# Patient Record
Sex: Female | Born: 1985 | Race: Black or African American | Hispanic: No | Marital: Married | Smoking: Never smoker
Health system: Southern US, Community
[De-identification: ages and names within clinical notes are randomized; demographics above are authoritative.]

## PROBLEM LIST (undated history)

## (undated) ENCOUNTER — Inpatient Hospital Stay (HOSPITAL_COMMUNITY): Payer: Self-pay

## (undated) DIAGNOSIS — O24419 Gestational diabetes mellitus in pregnancy, unspecified control: Secondary | ICD-10-CM

## (undated) DIAGNOSIS — O09299 Supervision of pregnancy with other poor reproductive or obstetric history, unspecified trimester: Secondary | ICD-10-CM

## (undated) DIAGNOSIS — Z789 Other specified health status: Secondary | ICD-10-CM

## (undated) HISTORY — DX: Other specified health status: Z78.9

## (undated) HISTORY — DX: Gestational diabetes mellitus in pregnancy, unspecified control: O24.419

## (undated) HISTORY — PX: TOOTH EXTRACTION: SUR596

## (undated) HISTORY — DX: Supervision of pregnancy with other poor reproductive or obstetric history, unspecified trimester: O09.299

---

## 2015-09-22 LAB — OB RESULTS CONSOLE GC/CHLAMYDIA
CHLAMYDIA, DNA PROBE: NEGATIVE
GC PROBE AMP, GENITAL: NEGATIVE

## 2015-09-22 LAB — OB RESULTS CONSOLE HGB/HCT, BLOOD
HCT: 36 %
HEMOGLOBIN: 11.6 g/dL

## 2015-09-22 LAB — OB RESULTS CONSOLE PLATELET COUNT: Platelets: 285 10*3/uL

## 2015-09-22 LAB — OB RESULTS CONSOLE VARICELLA ZOSTER ANTIBODY, IGG: Varicella: IMMUNE

## 2015-09-22 LAB — OB RESULTS CONSOLE ABO/RH: RH Type: POSITIVE

## 2015-09-22 LAB — OB RESULTS CONSOLE ANTIBODY SCREEN: ANTIBODY SCREEN: NEGATIVE

## 2015-09-22 LAB — OB RESULTS CONSOLE RUBELLA ANTIBODY, IGM: Rubella: IMMUNE

## 2015-09-22 LAB — OB RESULTS CONSOLE HIV ANTIBODY (ROUTINE TESTING): HIV: NONREACTIVE

## 2015-09-22 LAB — OB RESULTS CONSOLE HEPATITIS B SURFACE ANTIGEN: HEP B S AG: NEGATIVE

## 2015-11-23 ENCOUNTER — Encounter: Payer: Self-pay | Admitting: *Deleted

## 2015-11-23 DIAGNOSIS — Z3490 Encounter for supervision of normal pregnancy, unspecified, unspecified trimester: Secondary | ICD-10-CM

## 2015-11-29 ENCOUNTER — Encounter: Payer: Self-pay | Admitting: *Deleted

## 2015-11-30 ENCOUNTER — Ambulatory Visit (INDEPENDENT_AMBULATORY_CARE_PROVIDER_SITE_OTHER): Payer: Self-pay | Admitting: Advanced Practice Midwife

## 2015-11-30 ENCOUNTER — Encounter: Payer: Self-pay | Admitting: Advanced Practice Midwife

## 2015-11-30 VITALS — BP 98/56 | HR 71 | Temp 98.3°F | Ht 64.0 in | Wt 188.2 lb

## 2015-11-30 DIAGNOSIS — O09899 Supervision of other high risk pregnancies, unspecified trimester: Secondary | ICD-10-CM | POA: Insufficient documentation

## 2015-11-30 DIAGNOSIS — Z3492 Encounter for supervision of normal pregnancy, unspecified, second trimester: Secondary | ICD-10-CM

## 2015-11-30 DIAGNOSIS — O09219 Supervision of pregnancy with history of pre-term labor, unspecified trimester: Secondary | ICD-10-CM

## 2015-11-30 DIAGNOSIS — O09892 Supervision of other high risk pregnancies, second trimester: Secondary | ICD-10-CM

## 2015-11-30 DIAGNOSIS — O09212 Supervision of pregnancy with history of pre-term labor, second trimester: Secondary | ICD-10-CM

## 2015-11-30 DIAGNOSIS — O0990 Supervision of high risk pregnancy, unspecified, unspecified trimester: Secondary | ICD-10-CM | POA: Insufficient documentation

## 2015-11-30 DIAGNOSIS — O0992 Supervision of high risk pregnancy, unspecified, second trimester: Secondary | ICD-10-CM

## 2015-11-30 LAB — POCT URINALYSIS DIP (DEVICE)
Bilirubin Urine: NEGATIVE
Glucose, UA: NEGATIVE mg/dL
HGB URINE DIPSTICK: NEGATIVE
Ketones, ur: NEGATIVE mg/dL
LEUKOCYTES UA: NEGATIVE
NITRITE: NEGATIVE
PH: 5.5 (ref 5.0–8.0)
Protein, ur: NEGATIVE mg/dL
Specific Gravity, Urine: >=1.03 (ref 1.005–1.030)
UROBILINOGEN UA: 0.2 mg/dL (ref 0.0–1.0)

## 2015-11-30 NOTE — Progress Notes (Signed)
Subjective:  Veronica Torres is a 29 y.o. G2P0101 at 2120w3d being seen today for ongoing prenatal care.  She is currently monitored for the following issues for this high-risk pregnancy and has History of preterm delivery, currently pregnant and Supervision of high-risk pregnancy on her problem list.  Patient reports no complaints.  Contractions: Not present. Vag. Bleeding: None.  Movement: Present. Denies leaking of fluid.   The following portions of the patient's history were reviewed and updated as appropriate: allergies, current medications, past family history, past medical history, past social history, past surgical history and problem list. Problem list updated.  Objective:   Filed Vitals:   11/30/15 0921 11/30/15 0922  BP: 98/56   Pulse: 71   Temp: 98.3 F (36.8 C)   Height:  5\' 4"  (1.626 m)  Weight: 188 lb 3.2 oz (85.367 kg)     Fetal Status: Fetal Heart Rate (bpm): 156   Movement: Present     General:  Alert, oriented and cooperative. Patient is in no acute distress.  Skin: Skin is warm and dry. No rash noted.   Cardiovascular: Normal heart rate noted  Respiratory: Normal respiratory effort, no problems with respiration noted  Abdomen: Soft, gravid, appropriate for gestational age. Pain/Pressure: Present     Pelvic: Vag. Bleeding: None     Cervical exam deferred        Extremities: Normal range of motion.  Edema: None  Mental Status: Normal mood and affect. Normal behavior. Normal judgment and thought content.   Urinalysis: Urine Protein: Negative Urine Glucose: Negative  Assessment and Plan:  Pregnancy: G2P0101 at 2820w3d  1. History of preterm delivery, currently pregnant, second trimester --Reviewed pt transfer records including labs, genetic screening, US - US OB DETAIL + 14 WK; Future --Recommend 17-P, pt undecided  Preterm labor symptoms and general obstetric precautions including but not limited to vaginal bleeding, contractions, leaking of fluid and fetal movement  were reviewed in detail with the patient. Please refer to After Visit Summary for other counseling recommendations.  Return in about 4 weeks (around 12/28/2015).   Hurshel PartyLisa A Leftwich-Kirby, CNM

## 2015-12-11 HISTORY — DX: Maternal care for unspecified type scar from previous cesarean delivery: O34.219

## 2015-12-11 NOTE — L&D Delivery Note (Signed)
Obstetrical Delivery Note   Date of Delivery:   05/10/2016 Primary OB:   Other High risk clinic Gestational Age/EDD: 134w4d Antepartum complications: none  Delivered By:   Cornelia Copaharlie Carleta Woodrow, Jr MD  Delivery Type:   spontaneous vaginal delivery  Delivery Details:   Went to see patient for some decels (variables vs early) with pushing and fetus noted to be +4. No more decels while I was in the room and over the next few contractions, she easily delivered an OA infant without difficulty; peds was present at delivery. Infant vigorous and with good cry immediately upon delivery so delayed cord clamping done and then cord cut by father.   Anesthesia:    epidural Intrapartum complications:  Light meconium GBS:    Negative Laceration:    none Episiotomy:    none Placenta:    Delivered and expressed via active management. Intact: yes. To pathology: yes.  Estimated Blood Loss:  150mL  Baby:    Liveborn female, APGARs 9/10, weight 3090gm  Cornelia Copaharlie Gwyn Mehring, Jr. MD Attending Center for [facility](Faculty Practice)

## 2015-12-14 ENCOUNTER — Ambulatory Visit (HOSPITAL_COMMUNITY)
Admission: RE | Admit: 2015-12-14 | Discharge: 2015-12-14 | Disposition: A | Discharge status: Home / Self Care | Payer: Medicaid Other | Source: Ambulatory Visit | Attending: Advanced Practice Midwife | Admitting: Advanced Practice Midwife

## 2015-12-14 ENCOUNTER — Other Ambulatory Visit: Payer: Self-pay | Admitting: General Practice

## 2015-12-14 DIAGNOSIS — Z3689 Encounter for other specified antenatal screening: Secondary | ICD-10-CM

## 2015-12-14 DIAGNOSIS — O09892 Supervision of other high risk pregnancies, second trimester: Secondary | ICD-10-CM

## 2015-12-14 DIAGNOSIS — Z3A20 20 weeks gestation of pregnancy: Secondary | ICD-10-CM

## 2015-12-14 DIAGNOSIS — O09212 Supervision of pregnancy with history of pre-term labor, second trimester: Secondary | ICD-10-CM | POA: Diagnosis present

## 2015-12-14 DIAGNOSIS — O0992 Supervision of high risk pregnancy, unspecified, second trimester: Secondary | ICD-10-CM

## 2015-12-28 ENCOUNTER — Encounter: Payer: Self-pay | Admitting: Family

## 2015-12-28 ENCOUNTER — Ambulatory Visit (INDEPENDENT_AMBULATORY_CARE_PROVIDER_SITE_OTHER): Payer: Self-pay | Admitting: Family

## 2015-12-28 VITALS — BP 114/66 | HR 78 | Temp 98.4°F | Wt 191.0 lb

## 2015-12-28 DIAGNOSIS — O09892 Supervision of other high risk pregnancies, second trimester: Secondary | ICD-10-CM

## 2015-12-28 DIAGNOSIS — O09212 Supervision of pregnancy with history of pre-term labor, second trimester: Secondary | ICD-10-CM

## 2015-12-28 DIAGNOSIS — O0992 Supervision of high risk pregnancy, unspecified, second trimester: Secondary | ICD-10-CM

## 2015-12-28 LAB — POCT URINALYSIS DIP (DEVICE)
Glucose, UA: NEGATIVE mg/dL
NITRITE: NEGATIVE
PH: 6 (ref 5.0–8.0)
Protein, ur: NEGATIVE mg/dL
Specific Gravity, Urine: >=1.03 (ref 1.005–1.030)
Urobilinogen, UA: 0.2 mg/dL (ref 0.0–1.0)

## 2015-12-28 NOTE — Progress Notes (Signed)
Patient reports normal aches and pains of pregnancy Reviewed tip of week with patient Please review ultrasound for dating  Patient has questions about "high risk" diagnosis Review UA results

## 2015-12-28 NOTE — Progress Notes (Signed)
Subjective:  Veronica Torres is a 30 y.o. G2P0101 at [redacted]w[redacted]d being seen today for ongoing prenatal care.  She is currently monitored for the following issues for this high-risk pregnancy and has History of preterm delivery, currently pregnant and Supervision of high-risk pregnancy on her problem list.  Patient reports no complaints.  Contractions: Not present. Vag. Bleeding: None.  Movement: Present. Denies leaking of fluid.   The following portions of the patient's history were reviewed and updated as appropriate: allergies, current medications, past family history, past medical history, past social history, past surgical history and problem list. Problem list updated.  Objective:   Filed Vitals:   12/28/15 0803  BP: 114/66  Pulse: 78  Temp: 98.4 F (36.9 C)  Weight: 191 lb (86.637 kg)    Fetal Status: Fetal Heart Rate (bpm): 150 Fundal Height: 22 cm Movement: Present     General:  Alert, oriented and cooperative. Patient is in no acute distress.  Skin: Skin is warm and dry. No rash noted.   Cardiovascular: Normal heart rate noted  Respiratory: Normal respiratory effort, no problems with respiration noted  Abdomen: Soft, gravid, appropriate for gestational age. Pain/Pressure: Present     Pelvic: Vag. Bleeding: None     Cervical exam deferred        Extremities: Normal range of motion.  Edema: None  Mental Status: Normal mood and affect. Normal behavior. Normal judgment and thought content.   Urinalysis: Urine Protein: Negative Urine Glucose: Negative  Assessment and Plan:  Pregnancy: G2P0101 at [redacted]w[redacted]d  1. History of preterm delivery, currently pregnant, second trimester - discussed 17, benefits > declined  2. Supervision of high-risk pregnancy, second trimester - RPR  Preterm labor symptoms and general obstetric precautions including but not limited to vaginal bleeding, contractions, leaking of fluid and fetal movement were reviewed in detail with the patient. Please refer to  After Visit Summary for other counseling recommendations.  Return in about 4 weeks (around 01/25/2016).   Eino Farber Kennith Gain, CNM

## 2015-12-29 LAB — RPR

## 2016-02-01 ENCOUNTER — Encounter: Payer: Self-pay | Admitting: Advanced Practice Midwife

## 2016-02-08 ENCOUNTER — Ambulatory Visit (INDEPENDENT_AMBULATORY_CARE_PROVIDER_SITE_OTHER): Payer: Medicaid Other | Admitting: Obstetrics and Gynecology

## 2016-02-08 VITALS — BP 97/69 | HR 82 | Temp 98.3°F | Wt 193.3 lb

## 2016-02-08 DIAGNOSIS — Z23 Encounter for immunization: Secondary | ICD-10-CM | POA: Diagnosis not present

## 2016-02-08 DIAGNOSIS — Z3482 Encounter for supervision of other normal pregnancy, second trimester: Secondary | ICD-10-CM | POA: Diagnosis present

## 2016-02-08 DIAGNOSIS — O0990 Supervision of high risk pregnancy, unspecified, unspecified trimester: Secondary | ICD-10-CM

## 2016-02-08 LAB — POCT URINALYSIS DIP (DEVICE)
BILIRUBIN URINE: NEGATIVE
Glucose, UA: NEGATIVE mg/dL
KETONES UR: NEGATIVE mg/dL
Leukocytes, UA: NEGATIVE
NITRITE: NEGATIVE
PH: 6.5 (ref 5.0–8.0)
Protein, ur: 30 mg/dL — AB
SPECIFIC GRAVITY, URINE: 1.015 (ref 1.005–1.030)
Urobilinogen, UA: 0.2 mg/dL (ref 0.0–1.0)

## 2016-02-08 LAB — CBC
HEMATOCRIT: 31.7 % — AB (ref 36.0–46.0)
HEMOGLOBIN: 10.4 g/dL — AB (ref 12.0–15.0)
MCH: 27.4 pg (ref 26.0–34.0)
MCHC: 32.8 g/dL (ref 30.0–36.0)
MCV: 83.4 fL (ref 78.0–100.0)
MPV: 11.1 fL (ref 8.6–12.4)
Platelets: 237 10*3/uL (ref 150–400)
RBC: 3.8 MIL/uL — AB (ref 3.87–5.11)
RDW: 14.1 % (ref 11.5–15.5)
WBC: 6.8 10*3/uL (ref 4.0–10.5)

## 2016-02-08 MED ORDER — TETANUS-DIPHTH-ACELL PERTUSSIS 5-2.5-18.5 LF-MCG/0.5 IM SUSP
0.5000 mL | Freq: Once | INTRAMUSCULAR | Status: AC
  Administered 2016-02-08: 0.5 mL via INTRAMUSCULAR

## 2016-02-08 NOTE — Addendum Note (Signed)
Addended by: Gerome Apley on: 02/08/2016 10:33 AM   Modules accepted: Orders

## 2016-02-08 NOTE — Progress Notes (Signed)
After discussion with Dr. Ashok Pall, Veronica Torres decided she would like to get tdap, but declines flu shot at this time.

## 2016-02-08 NOTE — Progress Notes (Signed)
Declines tdap 

## 2016-02-08 NOTE — Progress Notes (Signed)
Subjective:  Veronica Torres is a 30 y.o. G2P0101 at [redacted]w[redacted]d being seen today for ongoing prenatal care.  She is currently monitored for the following issues for this low-risk pregnancy and has History of preterm delivery, currently pregnant and Supervision of high-risk pregnancy on her problem list.  Patient reports no complaints.  Contractions: Not present. Vag. Bleeding: None.  Movement: Present. Denies leaking of fluid.   The following portions of the patient's history were reviewed and updated as appropriate: allergies, current medications, past family history, past medical history, past social history, past surgical history and problem list. Problem list updated.  Objective:   Filed Vitals:   02/08/16 1005  BP: 97/69  Pulse: 82  Temp: 98.3 F (36.8 C)  Weight: 193 lb 4.8 oz (87.68 kg)    Fetal Status: Fetal Heart Rate (bpm): 160   Movement: Present     General:  Alert, oriented and cooperative. Patient is in no acute distress.  Skin: Skin is warm and dry. No rash noted.   Cardiovascular: Normal heart rate noted  Respiratory: Normal respiratory effort, no problems with respiration noted  Abdomen: Soft, gravid, appropriate for gestational age. Pain/Pressure: Absent     Pelvic: Vag. Bleeding: None     Cervical exam deferred        Extremities: Normal range of motion.  Edema: None  Mental Status: Normal mood and affect. Normal behavior. Normal judgment and thought content.   Urinalysis:      Assessment and Plan:  Pregnancy: G2P0101 at [redacted]w[redacted]d  1. Supervision of high-risk pregnancy, unspecified trimester - tdap and flu. Successfully convinced patient! - 1-hour, cbc, rpr, hiv - f/u 2 wks  Preterm labor symptoms and general obstetric precautions including but not limited to vaginal bleeding, contractions, leaking of fluid and fetal movement were reviewed in detail with the patient. Please refer to After Visit Summary for other counseling recommendations.    Kathrynn Running,  MD

## 2016-02-09 LAB — HIV ANTIBODY (ROUTINE TESTING W REFLEX): HIV 1&2 Ab, 4th Generation: NONREACTIVE

## 2016-02-09 LAB — RPR

## 2016-02-09 LAB — GLUCOSE TOLERANCE, 1 HOUR (50G) W/O FASTING: Glucose, 1 Hr, gestational: 129 mg/dL (ref <140)

## 2016-02-20 ENCOUNTER — Encounter (HOSPITAL_COMMUNITY): Payer: Self-pay | Admitting: Vascular Surgery

## 2016-02-20 DIAGNOSIS — K5900 Constipation, unspecified: Secondary | ICD-10-CM | POA: Insufficient documentation

## 2016-02-20 DIAGNOSIS — Z3A29 29 weeks gestation of pregnancy: Secondary | ICD-10-CM | POA: Insufficient documentation

## 2016-02-20 DIAGNOSIS — R1030 Lower abdominal pain, unspecified: Secondary | ICD-10-CM | POA: Diagnosis not present

## 2016-02-20 DIAGNOSIS — O99413 Diseases of the circulatory system complicating pregnancy, third trimester: Secondary | ICD-10-CM | POA: Insufficient documentation

## 2016-02-20 DIAGNOSIS — M549 Dorsalgia, unspecified: Secondary | ICD-10-CM | POA: Diagnosis not present

## 2016-02-20 DIAGNOSIS — O212 Late vomiting of pregnancy: Secondary | ICD-10-CM | POA: Insufficient documentation

## 2016-02-20 DIAGNOSIS — O9989 Other specified diseases and conditions complicating pregnancy, childbirth and the puerperium: Secondary | ICD-10-CM | POA: Insufficient documentation

## 2016-02-20 LAB — URINE MICROSCOPIC-ADD ON

## 2016-02-20 LAB — URINALYSIS, ROUTINE W REFLEX MICROSCOPIC
BILIRUBIN URINE: NEGATIVE
Glucose, UA: NEGATIVE mg/dL
Hgb urine dipstick: NEGATIVE
KETONES UR: 15 mg/dL — AB
LEUKOCYTES UA: NEGATIVE
NITRITE: NEGATIVE
PROTEIN: 100 mg/dL — AB
Specific Gravity, Urine: 1.021 (ref 1.005–1.030)
pH: 8.5 — ABNORMAL HIGH (ref 5.0–8.0)

## 2016-02-20 LAB — CBC
HCT: 31.3 % — ABNORMAL LOW (ref 36.0–46.0)
Hemoglobin: 10.4 g/dL — ABNORMAL LOW (ref 12.0–15.0)
MCH: 27.2 pg (ref 26.0–34.0)
MCHC: 33.2 g/dL (ref 30.0–36.0)
MCV: 81.7 fL (ref 78.0–100.0)
PLATELETS: 198 10*3/uL (ref 150–400)
RBC: 3.83 MIL/uL — ABNORMAL LOW (ref 3.87–5.11)
RDW: 13.1 % (ref 11.5–15.5)
WBC: 9.1 10*3/uL (ref 4.0–10.5)

## 2016-02-20 MED ORDER — ONDANSETRON 4 MG PO TBDP
ORAL_TABLET | ORAL | Status: AC
  Filled 2016-02-20: qty 1

## 2016-02-20 MED ORDER — ONDANSETRON 4 MG PO TBDP
4.0000 mg | ORAL_TABLET | Freq: Once | ORAL | Status: AC | PRN
  Administered 2016-02-20: 4 mg via ORAL

## 2016-02-20 NOTE — ED Notes (Signed)
Pt reports to the ED for eval of upper abd pain and N/V that began today. Describes the pain as a cramping and like she has food poisoning. Denies any diarrhea. Pt urinary symptoms, denies any vaginal bleeding and d/c. Pt [redacted] weeks pregnant. Has not had any problems with this pregnancy. Pt denies any unusual back pain or urge to bear down. Pt A&Ox4, resp e/u, and skin warm and dry.

## 2016-02-21 ENCOUNTER — Emergency Department (HOSPITAL_COMMUNITY)
Admission: EM | Admit: 2016-02-21 | Discharge: 2016-02-21 | Disposition: A | Discharge status: Home / Self Care | Payer: Medicaid Other | Attending: Emergency Medicine | Admitting: Emergency Medicine

## 2016-02-21 DIAGNOSIS — O09892 Supervision of other high risk pregnancies, second trimester: Secondary | ICD-10-CM

## 2016-02-21 DIAGNOSIS — R109 Unspecified abdominal pain: Secondary | ICD-10-CM

## 2016-02-21 DIAGNOSIS — O26899 Other specified pregnancy related conditions, unspecified trimester: Secondary | ICD-10-CM

## 2016-02-21 DIAGNOSIS — R1110 Vomiting, unspecified: Secondary | ICD-10-CM

## 2016-02-21 DIAGNOSIS — O0990 Supervision of high risk pregnancy, unspecified, unspecified trimester: Secondary | ICD-10-CM

## 2016-02-21 DIAGNOSIS — O09212 Supervision of pregnancy with history of pre-term labor, second trimester: Secondary | ICD-10-CM

## 2016-02-21 LAB — COMPREHENSIVE METABOLIC PANEL
ALK PHOS: 59 U/L (ref 38–126)
ALT: 13 U/L — AB (ref 14–54)
AST: 19 U/L (ref 15–41)
Albumin: 3.1 g/dL — ABNORMAL LOW (ref 3.5–5.0)
Anion gap: 13 (ref 5–15)
BILIRUBIN TOTAL: 0.4 mg/dL (ref 0.3–1.2)
BUN: <5 mg/dL — ABNORMAL LOW (ref 6–20)
CALCIUM: 8.9 mg/dL (ref 8.9–10.3)
CO2: 18 mmol/L — AB (ref 22–32)
CREATININE: 0.58 mg/dL (ref 0.44–1.00)
Chloride: 105 mmol/L (ref 101–111)
Glucose, Bld: 88 mg/dL (ref 65–99)
Potassium: 3.8 mmol/L (ref 3.5–5.1)
SODIUM: 136 mmol/L (ref 135–145)
TOTAL PROTEIN: 6.9 g/dL (ref 6.5–8.1)

## 2016-02-21 LAB — HCG, QUANTITATIVE, PREGNANCY: hCG, Beta Chain, Quant, S: 12635 m[IU]/mL — ABNORMAL HIGH (ref <5)

## 2016-02-21 MED ORDER — SODIUM CHLORIDE 0.9 % IV BOLUS (SEPSIS)
1000.0000 mL | Freq: Once | INTRAVENOUS | Status: AC
  Administered 2016-02-21: 1000 mL via INTRAVENOUS

## 2016-02-21 NOTE — ED Notes (Signed)
Pt verbalizes understanding of instructions. 

## 2016-02-21 NOTE — ED Notes (Signed)
RROB nurse states OB has cleared\ patient

## 2016-02-21 NOTE — ED Provider Notes (Signed)
CSN: [id number]     Arrival date & time 02/20/16  2254 History   First MD Initiated Contact with Patient 02/21/16 0802     Chief Complaint  Patient presents with  . Abdominal Pain     (Consider location/radiation/quality/duration/timing/severity/associated sxs/prior Treatment) HPI  30 year old female who is G2 P1 and approximately [redacted] weeks pregnant presents with nausea, vomiting, and abdominal pain. States she didn't feel well yesterday around noon. Around 7 PM she vomited once. Vomited again about one hour later. No hematemesis. This started after having a bowel movement after being constipated. Has not had any diarrhea. Patient states originally she was having upper abdominal discomfort, now is having some lower abdominal discomfort and back pain. It has improved while she has been in the waiting room for several hours. She was given Zofran once in the waiting room but this seems to have worn off. Mildly nauseated, does not one further medicines at this time. No fevers. She states the abdominal pain feels like a prior time when she had "food poisoning". Denies vaginal bleeding, discharge. She states earlier in the night she was having trouble feeling baby move but after having a piece of candy she states the baby is now quite active.  Past Medical History  Diagnosis Date  . Medical history non-contributory    Past Surgical History  Procedure Laterality Date  . No past surgeries    . Tooth extraction     No family history on file. Social History  Substance Use Topics  . Smoking status: Never Smoker   . Smokeless tobacco: None  . Alcohol Use: No   OB History    Gravida Para Term Preterm AB TAB SAB Ectopic Multiple Living   2 1  1      1      Review of Systems  Constitutional: Negative for fever.  Gastrointestinal: Positive for nausea, vomiting and abdominal pain. Negative for diarrhea.  Genitourinary: Negative for dysuria, vaginal bleeding and vaginal discharge.  Musculoskeletal:  Positive for back pain.  All other systems reviewed and are negative.     Allergies  Review of patient's allergies indicates no known allergies.  Home Medications   Prior to Admission medications   Medication Sig Start Date End Date Taking? Authorizing Provider  Prenatal Vit-Fe Fumarate-FA (MULTIVITAMIN-PRENATAL) 27-0.8 MG TABS tablet Take 1 tablet by mouth daily at 12 noon.    Historical Provider, MD   BP 114/62 mmHg  Pulse 80  Temp(Src) 98.1 F (36.7 C) (Oral)  Resp 16  Wt 196 lb 1.6 oz (88.95 kg)  SpO2 98%  LMP 07/31/2015 Physical Exam  Constitutional: She is oriented to person, place, and time. She appears well-developed and well-nourished. No distress.  HENT:  Head: Normocephalic and atraumatic.  Right Ear: External ear normal.  Left Ear: External ear normal.  Nose: Nose normal.  Eyes: Right eye exhibits no discharge. Left eye exhibits no discharge.  Cardiovascular: Normal rate, regular rhythm and normal heart sounds.   Pulmonary/Chest: Effort normal and breath sounds normal.  Abdominal: Soft. There is tenderness.  Mild lower abdominal tenderness Gravid  Neurological: She is alert and oriented to person, place, and time.  Skin: Skin is warm and dry. She is not diaphoretic.  Nursing note and vitals reviewed.   ED Course  Procedures (including critical care time) Labs Review Labs Reviewed  COMPREHENSIVE METABOLIC PANEL - Abnormal; Notable for the following:    CO2 18 (*)    BUN <5 (*)    Albumin 3.1 (*)  ALT 13 (*)    All other components within normal limits  CBC - Abnormal; Notable for the following:    RBC 3.83 (*)    Hemoglobin 10.4 (*)    HCT 31.3 (*)    All other components within normal limits  URINALYSIS, ROUTINE W REFLEX MICROSCOPIC (NOT AT [facility]) - Abnormal; Notable for the following:    pH 8.5 (*)    Ketones, ur 15 (*)    Protein, ur 100 (*)    All other components within normal limits  HCG, QUANTITATIVE, PREGNANCY - Abnormal; Notable for  the following:    hCG, Beta Chain, Quant, [place] [address] (*)    All other components within normal limits  URINE MICROSCOPIC-ADD ON - Abnormal; Notable for the following:    Squamous Epithelial / LPF 0-5 (*)    Bacteria, UA RARE (*)    All other components within normal limits    Imaging Review No results found. I have personally reviewed and evaluated these images and lab results as part of my medical decision-making.   EKG Interpretation None      MDM   Final diagnoses:  Abdominal pain during pregnancy  Vomiting in adult patient    Patient likely has a gastroenteritis causing vomiting. Pain and nausea much improved, she doesn't want more treatment. Rapid OB has evaluated, NST unremarkable. No concerning symptoms such as VB, leakage of fluid or contractions. Hydrate, f/u with OB. Discussed return precautions. Low suspicion for serious intra-abd pathology such as pregnancy emergency, appendicitis, bowel obstruction, pancreatitis, etc.    Pricilla Loveless, MD 02/21/16 1734

## 2016-02-21 NOTE — ED Notes (Signed)
Pt returns from [facility]. States she had vomiting and abdominal pain last night after eating soup. vomtted x 2. States abdominal pain improved and intermittent.

## 2016-02-21 NOTE — ED Notes (Signed)
Called Dr Macon LargeAnyanwu informed of pt status, pt came into [facility] with vomiting and belly pain. Pt is g2p1 29.2 weeks, denies leaking fluid or bleeding. FHR tracing reassuring, no UC's noted. OB cleared.

## 2016-02-21 NOTE — ED Notes (Signed)
Brought patient back to room with family in tow; patient given gown to get undressed and place on; patient needs to go to the bathroom first, shown where bathroom is; visitor at bedside

## 2016-02-22 ENCOUNTER — Encounter: Payer: Medicaid Other | Admitting: Advanced Practice Midwife

## 2016-02-27 ENCOUNTER — Encounter: Payer: Medicaid Other | Admitting: Family Medicine

## 2016-02-29 ENCOUNTER — Ambulatory Visit (INDEPENDENT_AMBULATORY_CARE_PROVIDER_SITE_OTHER): Payer: Medicaid Other | Admitting: Family

## 2016-02-29 VITALS — BP 111/61 | HR 83 | Temp 98.4°F | Wt 197.0 lb

## 2016-02-29 DIAGNOSIS — O99013 Anemia complicating pregnancy, third trimester: Secondary | ICD-10-CM

## 2016-02-29 DIAGNOSIS — O0993 Supervision of high risk pregnancy, unspecified, third trimester: Secondary | ICD-10-CM

## 2016-02-29 DIAGNOSIS — D649 Anemia, unspecified: Secondary | ICD-10-CM

## 2016-02-29 DIAGNOSIS — O99019 Anemia complicating pregnancy, unspecified trimester: Secondary | ICD-10-CM | POA: Insufficient documentation

## 2016-02-29 LAB — POCT URINALYSIS DIP (DEVICE)
Glucose, UA: NEGATIVE mg/dL
HGB URINE DIPSTICK: NEGATIVE
Ketones, ur: NEGATIVE mg/dL
Leukocytes, UA: NEGATIVE
Nitrite: NEGATIVE
PROTEIN: 30 mg/dL — AB
Specific Gravity, Urine: 1.025 (ref 1.005–1.030)
Urobilinogen, UA: 0.2 mg/dL (ref 0.0–1.0)
pH: 6.5 (ref 5.0–8.0)

## 2016-02-29 MED ORDER — FUSION PLUS PO CAPS: 1.0000 | ORAL_CAPSULE | Freq: Every day | ORAL | Status: DC

## 2016-02-29 NOTE — Progress Notes (Signed)
Subjective:  Veronica Torres is a 30 y.o. G2P0101 at 589w3d being seen today for ongoing prenatal care.  She is currently monitored for the following issues for this high-risk pregnancy and has History of preterm delivery, currently pregnant; Supervision of high-risk pregnancy; and Anemia of mother in pregnancy, antepartum on her problem list.  Patient reports fatigue.  Contractions: Not present.  .  Movement: Present. Denies leaking of fluid.   The following portions of the patient's history were reviewed and updated as appropriate: allergies, current medications, past family history, past medical history, past social history, past surgical history and problem list. Problem list updated.  Objective:   Filed Vitals:   02/29/16 1007  BP: 111/61  Pulse: 83  Temp: 98.4 F (36.9 C)  Weight: 197 lb (89.359 kg)    Fetal Status: Fetal Heart Rate (bpm): 156 Fundal Height: 30 cm Movement: Present     General:  Alert, oriented and cooperative. Patient is in no acute distress.  Skin: Skin is warm and dry. No rash noted.   Cardiovascular: Normal heart rate noted  Respiratory: Normal respiratory effort, no problems with respiration noted  Abdomen: Soft, gravid, appropriate for gestational age. Pain/Pressure: Present     Pelvic:       Cervical exam deferred        Extremities: Normal range of motion.  Edema: None  Mental Status: Normal mood and affect. Normal behavior. Normal judgment and thought content.   Urinalysis: Urine Protein: 1+ Urine Glucose: Negative  Assessment and Plan:  Pregnancy: G2P0101 at 499w3d  1. Anemia of mother in pregnancy, antepartum, third trimester - Iron-FA-B Cmp-C-Biot-Probiotic (FUSION PLUS) CAPS; Take 1 capsule by mouth daily.  Dispense: 30 capsule; Refill: 6  2. Supervision of high-risk pregnancy, third trimester - Reviewed third trimester labs  Preterm labor symptoms and general obstetric precautions including but not limited to vaginal bleeding, contractions,  leaking of fluid and fetal movement were reviewed in detail with the patient. Please refer to After Visit Summary for other counseling recommendations.  Return in about 2 weeks (around 03/14/2016).   Eino FarberWalidah Kennith GainN Karim, CNM

## 2016-03-14 ENCOUNTER — Encounter: Payer: Medicaid Other | Admitting: Advanced Practice Midwife

## 2016-03-28 ENCOUNTER — Ambulatory Visit (INDEPENDENT_AMBULATORY_CARE_PROVIDER_SITE_OTHER): Payer: Medicaid Other | Admitting: Advanced Practice Midwife

## 2016-03-28 VITALS — BP 118/59 | HR 71 | Wt 197.0 lb

## 2016-03-28 DIAGNOSIS — O99013 Anemia complicating pregnancy, third trimester: Secondary | ICD-10-CM

## 2016-03-28 DIAGNOSIS — D649 Anemia, unspecified: Secondary | ICD-10-CM

## 2016-03-28 DIAGNOSIS — O0993 Supervision of high risk pregnancy, unspecified, third trimester: Secondary | ICD-10-CM

## 2016-03-28 LAB — POCT URINALYSIS DIP (DEVICE)
BILIRUBIN URINE: NEGATIVE
GLUCOSE, UA: NEGATIVE mg/dL
LEUKOCYTES UA: NEGATIVE
Nitrite: NEGATIVE
Protein, ur: 100 mg/dL — AB
Urobilinogen, UA: 0.2 mg/dL (ref 0.0–1.0)
pH: 6 (ref 5.0–8.0)

## 2016-03-28 NOTE — Progress Notes (Signed)
Subjective:  Veronica Torres is a 30 y.o. G2P0101 at 579w3d being seen today for ongoing prenatal care.  She is currently monitored for the following issues for this high-risk pregnancy and has History of preterm delivery, currently pregnant; Supervision of high-risk pregnancy; and Anemia of mother in pregnancy, antepartum on her problem list.  Patient reports episode of regular contractions last week that resolved, only occasional cramping now, not regular.  Contractions: Not present.  .  Movement: Present. Denies leaking of fluid.   The following portions of the patient's history were reviewed and updated as appropriate: allergies, current medications, past family history, past medical history, past social history, past surgical history and problem list. Problem list updated.  Objective:   Filed Vitals:   03/28/16 1037  BP: 118/59  Pulse: 71  Weight: 197 lb (89.359 kg)    Fetal Status: Fetal Heart Rate (bpm): 155 Fundal Height: 34 cm Movement: Present     General:  Alert, oriented and cooperative. Patient is in no acute distress.  Skin: Skin is warm and dry. No rash noted.   Cardiovascular: Normal heart rate noted  Respiratory: Normal respiratory effort, no problems with respiration noted  Abdomen: Soft, gravid, appropriate for gestational age. Pain/Pressure: Present     Pelvic:       Cervical exam deferred        Extremities: Normal range of motion.     Mental Status: Normal mood and affect. Normal behavior. Normal judgment and thought content.   Urinalysis:      Assessment and Plan:  Pregnancy: G2P0101 at 419w3d  1. Supervision of high-risk pregnancy, third trimester   2. Anemia of mother in pregnancy, antepartum, third trimester --Hgb 10, but pt fatigued, sometimes SOB. Taking gummy prenatal vitamin with no iron. Rx for Fusion Plus at last visit but did not pick up r/t cost.  Discussed OTC/Rx iron options.  Pt prefers to pick up Fusion Plus and start today.  Preterm labor  symptoms and general obstetric precautions including but not limited to vaginal bleeding, contractions, leaking of fluid and fetal movement were reviewed in detail with the patient. Please refer to After Visit Summary for other counseling recommendations.  Return in about 2 weeks (around 04/11/2016).   Hurshel PartyLisa A Leftwich-Kirby, CNM

## 2016-03-28 NOTE — Patient Instructions (Signed)
Pregnancy and Anemia °Anemia is a condition in which the concentration of red blood cells or hemoglobin in the blood is below normal. Hemoglobin is a substance in red blood cells that carries oxygen to the tissues of the body. Anemia results in not enough oxygen reaching these tissues.  °Anemia during pregnancy is common because the fetus uses more iron and folic acid as it is developing. Your body may not produce enough red blood cells because of this. Also, during pregnancy, the liquid part of the blood (plasma) increases by about 50%, and the red blood cells increase by only 25%. This lowers the concentration of the red blood cells and creates a natural anemia-like situation.  °CAUSES  °The most common cause of anemia during pregnancy is not having enough iron in the body to make red blood cells (iron deficiency anemia). Other causes may include: °· Folic acid deficiency. °· Vitamin B12 deficiency. °· Certain prescription or over-the-counter medicines. °· Certain medical conditions or infections that destroy red blood cells. °· A low platelet count and bleeding caused by antibodies that go through the placenta to the fetus from the mother's blood. °SIGNS AND SYMPTOMS  °Mild anemia may not be noticeable. If it becomes severe, symptoms may include: °· Tiredness. °· Shortness of breath, especially with exercise. °· Weakness. °· Fainting. °· Pale looking skin. °· Headaches. °· Feeling a fast or irregular heartbeat (palpitations). °DIAGNOSIS  °The type of anemia is usually diagnosed from your family and medical history and blood tests. °TREATMENT  °Treatment of anemia during pregnancy depends on the cause of the anemia. Treatment can include: °· Supplements of iron, vitamin B12, or folic acid. °· A blood transfusion. This may be needed if blood loss is severe. °· Hospitalization. This may be needed if there is significant continual blood loss. °· Dietary changes. °HOME CARE INSTRUCTIONS  °· Follow your dietitian's or  health care provider's dietary recommendations. °· Increase your vitamin C intake. This will help the stomach absorb more iron. °· Eat a diet rich in iron. This would include foods such as: °¨ Liver. °¨ Beef. °¨ Whole grain bread. °¨ Eggs. °¨ Dried fruit. °· Take iron and vitamins as directed by your health care provider. °· Eat green leafy vegetables. These are a good source of folic acid. °SEEK MEDICAL CARE IF:  °· You have frequent or lasting headaches. °· You are looking pale. °· You are bruising easily. °SEEK IMMEDIATE MEDICAL CARE IF:  °· You have extreme weakness, shortness of breath, or chest pain. °· You become dizzy or have trouble concentrating. °· You have heavy vaginal bleeding. °· You develop a rash. °· You have bloody or black, tarry stools. °· You faint. °· You vomit up blood. °· You vomit repeatedly. °· You have abdominal pain. °· You have a fever or persistent symptoms for more than 2-3 days. °· You have a fever and your symptoms suddenly get worse. °· You are dehydrated. °MAKE SURE YOU:  °· Understand these instructions. °· Will watch your condition. °· Will get help right away if you are not doing well or get worse. °  °This information is not intended to replace advice given to you by your health care provider. Make sure you discuss any questions you have with your health care provider. °  °Document Released: 11/23/2000 Document Revised: 09/16/2013 Document Reviewed: 07/08/2013 °Elsevier Interactive Patient Education ©2016 [employer] ° °

## 2016-04-10 ENCOUNTER — Inpatient Hospital Stay (HOSPITAL_COMMUNITY)
Admission: AD | Admit: 2016-04-10 | Discharge: 2016-04-10 | Disposition: A | Discharge status: Home / Self Care | Payer: Medicaid Other | Source: Ambulatory Visit | Attending: Family Medicine | Admitting: Family Medicine

## 2016-04-10 ENCOUNTER — Encounter (HOSPITAL_COMMUNITY): Payer: Self-pay | Admitting: *Deleted

## 2016-04-10 DIAGNOSIS — O26893 Other specified pregnancy related conditions, third trimester: Secondary | ICD-10-CM | POA: Diagnosis not present

## 2016-04-10 DIAGNOSIS — O09213 Supervision of pregnancy with history of pre-term labor, third trimester: Secondary | ICD-10-CM | POA: Diagnosis not present

## 2016-04-10 DIAGNOSIS — R002 Palpitations: Secondary | ICD-10-CM | POA: Diagnosis present

## 2016-04-10 DIAGNOSIS — R42 Dizziness and giddiness: Secondary | ICD-10-CM | POA: Diagnosis present

## 2016-04-10 DIAGNOSIS — R0602 Shortness of breath: Secondary | ICD-10-CM | POA: Insufficient documentation

## 2016-04-10 DIAGNOSIS — D649 Anemia, unspecified: Secondary | ICD-10-CM | POA: Diagnosis not present

## 2016-04-10 DIAGNOSIS — O09212 Supervision of pregnancy with history of pre-term labor, second trimester: Secondary | ICD-10-CM

## 2016-04-10 DIAGNOSIS — O99013 Anemia complicating pregnancy, third trimester: Secondary | ICD-10-CM | POA: Insufficient documentation

## 2016-04-10 DIAGNOSIS — O0993 Supervision of high risk pregnancy, unspecified, third trimester: Secondary | ICD-10-CM

## 2016-04-10 DIAGNOSIS — O09892 Supervision of other high risk pregnancies, second trimester: Secondary | ICD-10-CM

## 2016-04-10 DIAGNOSIS — Z3A36 36 weeks gestation of pregnancy: Secondary | ICD-10-CM | POA: Insufficient documentation

## 2016-04-10 LAB — CBC
HEMATOCRIT: 30.8 % — AB (ref 36.0–46.0)
Hemoglobin: 10.1 g/dL — ABNORMAL LOW (ref 12.0–15.0)
MCH: 25.8 pg — AB (ref 26.0–34.0)
MCHC: 32.8 g/dL (ref 30.0–36.0)
MCV: 78.8 fL (ref 78.0–100.0)
PLATELETS: 204 10*3/uL (ref 150–400)
RBC: 3.91 MIL/uL (ref 3.87–5.11)
RDW: 13.6 % (ref 11.5–15.5)
WBC: 7.1 10*3/uL (ref 4.0–10.5)

## 2016-04-10 LAB — URINALYSIS, ROUTINE W REFLEX MICROSCOPIC
BILIRUBIN URINE: NEGATIVE
GLUCOSE, UA: NEGATIVE mg/dL
HGB URINE DIPSTICK: NEGATIVE
Ketones, ur: NEGATIVE mg/dL
Leukocytes, UA: NEGATIVE
Nitrite: NEGATIVE
PH: 6.5 (ref 5.0–8.0)
Protein, ur: NEGATIVE mg/dL
SPECIFIC GRAVITY, URINE: 1.02 (ref 1.005–1.030)

## 2016-04-10 NOTE — MAU Note (Signed)
These symptoms have been occasionally during the pregnancy. However, last night patient you had a hard time breathing while sleeping, would wake up gasping for air. Dizziness is worse last night and this am. Sometimes feels her heart is racing, and hard to catch her breath.

## 2016-04-10 NOTE — MAU Provider Note (Signed)
History     CSN: [id number]  Arrival date and time: 04/10/16 4098   First Provider Initiated Contact with Patient 04/10/16 [phone]      Chief Complaint  Patient presents with  . Shortness of Breath  . Dizziness  . Palpitations   HPI This is a 30 year old G2P0101 at [redacted]w[redacted]d who presented to the MAU with shortness of breath, dizziness, and palpitations. She has had dizziness and palpitations since last night. She has experienced intermittent shortness of breath throughout her entire pregnancy, but over the last day, the shortness of breath has occurred with the dizziness. She also woke up suddenly from sleep, gasping for air. The dizziness and shortness of breath has been lasting anywhere from a few seconds to 1 minute. The dizziness comes on all of the sudden and is not associated with going from sitting to standing. She also notes that she has been chewing on more ice recently. She has been drinking plenty of water recently and has not had any vomiting or diarrhea.  She denies any vaginal bleeding, rectal bleeding, bleeding from the gums, easy bruising. She denies any headaches, blurry vision, or RUQ/epigastric pain.   She has a history of anemia this pregnancy. She was prescribed Fusion Plus but has not taken it.    Past Medical History  Diagnosis Date  . Medical history non-contributory     Past Surgical History  Procedure Laterality Date  . No past surgeries    . Tooth extraction      History reviewed. No pertinent family history.  Social History  Substance Use Topics  . Smoking status: Never Smoker   . Smokeless tobacco: None  . Alcohol Use: No    Allergies: No Known Allergies  Prescriptions prior to admission  Medication Sig Dispense Refill Last Dose  . Prenatal Vit-Fe Fumarate-FA (MULTIVITAMIN-PRENATAL) 27-0.8 MG TABS tablet Take 1 tablet by mouth daily at 12 noon.    04/09/2016 at Unknown time  . Iron-FA-B Cmp-C-Biot-Probiotic (FUSION PLUS) CAPS Take 1 capsule by mouth  daily. 30 capsule 6  at Unknown time    Review of Systems  Constitutional: Negative for fever and chills.  Eyes: Negative for blurred vision.  Respiratory: Positive for shortness of breath. Negative for cough.   Cardiovascular: Positive for palpitations. Negative for chest pain.  Gastrointestinal: Negative for heartburn, nausea, vomiting, abdominal pain and diarrhea.  Genitourinary: Negative for dysuria and hematuria.  Musculoskeletal: Negative for myalgias.  Skin: Negative for rash.  Neurological: Positive for dizziness. Negative for weakness and headaches.  Endo/Heme/Allergies: Negative for environmental allergies.   Physical Exam   Blood pressure 113/66, pulse 83, temperature 98.2 F (36.8 C), temperature source Oral, resp. rate 18, height  (1.626 m), weight 196 lb (88.905 kg), last menstrual period 07/31/2015, SpO2 100 %.  Physical Exam  Nursing note and vitals reviewed. Constitutional: She is oriented to person, place, and time. She appears well-developed and well-nourished. No distress.  Eyes: No scleral icterus.  Neck: Normal range of motion.  Cardiovascular: Normal rate, regular rhythm and normal heart sounds.   No murmur heard. Respiratory: Effort normal and breath sounds normal. She has no wheezes. She has no rales.  GI: Soft. There is no tenderness.  gravid  Musculoskeletal: Normal range of motion. She exhibits no edema.  No warmth or redness of lower extremities  Neurological: She is alert and oriented to person, place, and time.  Skin: Skin is warm and dry. No rash noted.    MAU Course  Procedures  MDM This is a 30 year old G2P0101 at 6983w2d who presented to the MAU with shortness of breath, palpitations, and dizziness that started last night. Arrhythmia/MI ruled out with normal EKG and normal cardiopulmonary exam. PE ruled out with 100% O2 on pulse ox, HR of 83, and lack of warmth, erythema, or edema of the lower extremities. Orthostatics were normal, so  orthostatic hypotension less likely. Pt has a history of anemia, but Hgb was 10.1, which is unlikely to be causing her symptoms. She also denies any active bleeding. Likely related to physiological changes associated with pregnancy.  Assessment and Plan  #Dizziness, SOB, palpitations: Likely physiologic, possibly in combination with a mild anemia. Arrhythmia, MI, PE, orthostatic hypotension, and severe anemia ruled out. - Continue Fusion Plus - Encouraged adequate hydration with water - Pt advised to follow-up in clinic if symptoms persist - Return precautions given - Continue routine prenatal care  Hilton SinclairKaty D Mayo 04/10/2016, 9:45 AM   OB fellow attestation: I have seen and examined this patient; I agree with above documentation in the resident's note.   Damian Leavellmanda Fanning is a 30 y.o. G2P0101 reporting dizziness, SOB, palpitation.  +FM, denies LOF, VB, contractions, vaginal discharge.  PE: BP 108/68 mmHg  Pulse 77  Temp(Src) 98.2 F (36.8 C) (Oral)  Resp 18  Ht 5\' 4"  (1.626 m)  Wt 196 lb (88.905 kg)  BMI 33.63 kg/m2  SpO2 99%  LMP 07/31/2015 Gen: calm comfortable, NAD Resp: normal effort, no distress Abd: gravid  ROS, labs, PMH reviewed NST reactive  Plan: - SOB/palpitations: EKG wnl and reviewed showing no arrthymia or ST changes. Unlikely PE given normal oxygenation, normal HR. Most likely normal physiology of pregnancy.  Return precautions reviewed.  -anemia: discussed importance of taking medications   Federico FlakeKimberly Niles Dione Mccombie, MD , MPH, ABFM Family Medicine, OB Fellow [facility]   Attending Physician: Tinnie Gensanya Pratt

## 2016-04-10 NOTE — Discharge Instructions (Signed)
It was so nice to meet you!  We do not think there is anything serious that is causing your dizziness, shortness of breath, and palpitations. Your hemoglobin level was 10.1, which is a little low but should not be causing those symptoms. We recommend that you take the iron supplement that your doctor prescribed for you.   If you continue to experience these symptoms, please call your OB clinic to be seen.Third Trimester of Pregnancy The third trimester is from week 29 through week 42, months 7 through 9. The third trimester is a time when the fetus is growing rapidly. At the end of the ninth month, the fetus is about 20 inches in length and weighs 6-10 pounds.  BODY CHANGES Your body goes through many changes during pregnancy. The changes vary from woman to woman.   Your weight will continue to increase. You can expect to gain 25-35 pounds (11-16 kg) by the end of the pregnancy.  You may begin to get stretch marks on your hips, abdomen, and breasts.  You may urinate more often because the fetus is moving lower into your pelvis and pressing on your bladder.  You may develop or continue to have heartburn as a result of your pregnancy.  You may develop constipation because certain hormones are causing the muscles that push waste through your intestines to slow down.  You may develop hemorrhoids or swollen, bulging veins (varicose veins).  You may have pelvic pain because of the weight gain and pregnancy hormones relaxing your joints between the bones in your pelvis. Backaches may result from overexertion of the muscles supporting your posture.  You may have changes in your hair. These can include thickening of your hair, rapid growth, and changes in texture. Some women also have hair loss during or after pregnancy, or hair that feels dry or thin. Your hair will most likely return to normal after your baby is born.  Your breasts will continue to grow and be tender. A yellow discharge may leak  from your breasts called colostrum.  Your belly button may stick out.  You may feel short of breath because of your expanding uterus.  You may notice the fetus "dropping," or moving lower in your abdomen.  You may have a bloody mucus discharge. This usually occurs a few days to a week before labor begins.  Your cervix becomes thin and soft (effaced) near your due date. WHAT TO EXPECT AT YOUR PRENATAL EXAMS  You will have prenatal exams every 2 weeks until week 36. Then, you will have weekly prenatal exams. During a routine prenatal visit:  You will be weighed to make sure you and the fetus are growing normally.  Your blood pressure is taken.  Your abdomen will be measured to track your baby's growth.  The fetal heartbeat will be listened to.  Any test results from the previous visit will be discussed.  You may have a cervical check near your due date to see if you have effaced. At around 36 weeks, your caregiver will check your cervix. At the same time, your caregiver will also perform a test on the secretions of the vaginal tissue. This test is to determine if a type of bacteria, Group B streptococcus, is present. Your caregiver will explain this further. Your caregiver may ask you:  What your birth plan is.  How you are feeling.  If you are feeling the baby move.  If you have had any abnormal symptoms, such as leaking fluid, bleeding, severe  headaches, or abdominal cramping.  If you are using any tobacco products, including cigarettes, chewing tobacco, and electronic cigarettes.  If you have any questions. Other tests or screenings that may be performed during your third trimester include:  Blood tests that check for low iron levels (anemia).  Fetal testing to check the health, activity level, and growth of the fetus. Testing is done if you have certain medical conditions or if there are problems during the pregnancy.  HIV (human immunodeficiency virus) testing. If you  are at high risk, you may be screened for HIV during your third trimester of pregnancy. FALSE LABOR You may feel small, irregular contractions that eventually go away. These are called Braxton Hicks contractions, or false labor. Contractions may last for hours, days, or even weeks before true labor sets in. If contractions come at regular intervals, intensify, or become painful, it is best to be seen by your caregiver.  SIGNS OF LABOR   Menstrual-like cramps.  Contractions that are 5 minutes apart or less.  Contractions that start on the top of the uterus and spread down to the lower abdomen and back.  A sense of increased pelvic pressure or back pain.  A watery or bloody mucus discharge that comes from the vagina. If you have any of these signs before the 37th week of pregnancy, call your caregiver right away. You need to go to the hospital to get checked immediately. HOME CARE INSTRUCTIONS   Avoid all smoking, herbs, alcohol, and unprescribed drugs. These chemicals affect the formation and growth of the baby.  Do not use any tobacco products, including cigarettes, chewing tobacco, and electronic cigarettes. If you need help quitting, ask your health care provider. You may receive counseling support and other resources to help you quit.  Follow your caregiver's instructions regarding medicine use. There are medicines that are either safe or unsafe to take during pregnancy.  Exercise only as directed by your caregiver. Experiencing uterine cramps is a good sign to stop exercising.  Continue to eat regular, healthy meals.  Wear a good support bra for breast tenderness.  Do not use hot tubs, steam rooms, or saunas.  Wear your seat belt at all times when driving.  Avoid raw meat, uncooked cheese, cat litter boxes, and soil used by cats. These carry germs that can cause birth defects in the baby.  Take your prenatal vitamins.  Take 1500-2000 mg of calcium daily starting at the 20th  week of pregnancy until you deliver your baby.  Try taking a stool softener (if your caregiver approves) if you develop constipation. Eat more high-fiber foods, such as fresh vegetables or fruit and whole grains. Drink plenty of fluids to keep your urine clear or pale yellow.  Take warm sitz baths to soothe any pain or discomfort caused by hemorrhoids. Use hemorrhoid cream if your caregiver approves.  If you develop varicose veins, wear support hose. Elevate your feet for 15 minutes, 3-4 times a day. Limit salt in your diet.  Avoid heavy lifting, wear low heal shoes, and practice good posture.  Rest a lot with your legs elevated if you have leg cramps or low back pain.  Visit your dentist if you have not gone during your pregnancy. Use a soft toothbrush to brush your teeth and be gentle when you floss.  A sexual relationship may be continued unless your caregiver directs you otherwise.  Do not travel far distances unless it is absolutely necessary and only with the approval of your caregiver.  Take prenatal classes to understand, practice, and ask questions about the labor and delivery.  Make a trial run to the hospital.  Pack your hospital bag.  Prepare the baby's nursery.  Continue to go to all your prenatal visits as directed by your caregiver. SEEK MEDICAL CARE IF:  You are unsure if you are in labor or if your water has broken.  You have dizziness.  You have mild pelvic cramps, pelvic pressure, or nagging pain in your abdominal area.  You have persistent nausea, vomiting, or diarrhea.  You have a bad smelling vaginal discharge.  You have pain with urination. SEEK IMMEDIATE MEDICAL CARE IF:   You have a fever.  You are leaking fluid from your vagina.  You have spotting or bleeding from your vagina.  You have severe abdominal cramping or pain.  You have rapid weight loss or gain.  You have shortness of breath with chest pain.  You notice sudden or extreme  swelling of your face, hands, ankles, feet, or legs.  You have not felt your baby move in over an hour.  You have severe headaches that do not go away with medicine.  You have vision changes.   This information is not intended to replace advice given to you by your health care provider. Make sure you discuss any questions you have with your health care provider.   Document Released: 11/20/2001 Document Revised: 12/17/2014 Document Reviewed: 01/27/2013 Elsevier Interactive Patient Education 2016 [employer] Fetal Movement Counts Patient Name: __________________________________________________ Patient Due Date: ____________________ Performing a fetal movement count is highly recommended in high-risk pregnancies, but it is good for every pregnant woman to do. Your health care provider may ask you to start counting fetal movements at 28 weeks of the pregnancy. Fetal movements often increase:  After eating a full meal.  After physical activity.  After eating or drinking something sweet or cold.  At rest. Pay attention to when you feel the baby is most active. This will help you notice a pattern of your baby's sleep and wake cycles and what factors contribute to an increase in fetal movement. It is important to perform a fetal movement count at the same time each day when your baby is normally most active.  HOW TO COUNT FETAL MOVEMENTS  Find a quiet and comfortable area to sit or lie down on your left side. Lying on your left side provides the best blood and oxygen circulation to your baby.  Write down the day and time on a sheet of paper or in a journal.  Start counting kicks, flutters, swishes, rolls, or jabs in a 2-hour period. You should feel at least 10 movements within 2 hours.  If you do not feel 10 movements in 2 hours, wait 2-3 hours and count again. Look for a change in the pattern or not enough counts in 2 hours. SEEK MEDICAL CARE IF:  You feel less than 10 counts in 2  hours, tried twice.  There is no movement in over an hour.  The pattern is changing or taking longer each day to reach 10 counts in 2 hours.  You feel the baby is not moving as he or she usually does. Date: ____________ Movements: ____________ Start time: ____________ Doreatha Martin time: ____________  Date: ____________ Movements: ____________ Start time: ____________ Doreatha Martin time: ____________ Date: ____________ Movements: ____________ Start time: ____________ Doreatha Martin time: ____________ Date: ____________ Movements: ____________ Start time: ____________ Doreatha Martin time: ____________ Date: ____________ Movements: ____________ Start time: ____________ Doreatha Martin time: ____________  Date: ____________ Movements: ____________ Start time: ____________ Doreatha Martin time: ____________ Date: ____________ Movements: ____________ Start time: ____________ Doreatha Martin time: ____________ Date: ____________ Movements: ____________ Start time: ____________ Doreatha Martin time: ____________  Date: ____________ Movements: ____________ Start time: ____________ Doreatha Martin time: ____________ Date: ____________ Movements: ____________ Start time: ____________ Doreatha Martin time: ____________ Date: ____________ Movements: ____________ Start time: ____________ Doreatha Martin time: ____________ Date: ____________ Movements: ____________ Start time: ____________ Doreatha Martin time: ____________ Date: ____________ Movements: ____________ Start time: ____________ Doreatha Martin time: ____________ Date: ____________ Movements: ____________ Start time: ____________ Doreatha Martin time: ____________ Date: ____________ Movements: ____________ Start time: ____________ Doreatha Martin time: ____________  Date: ____________ Movements: ____________ Start time: ____________ Doreatha Martin time: ____________ Date: ____________ Movements: ____________ Start time: ____________ Doreatha Martin time: ____________ Date: ____________ Movements: ____________ Start time: ____________ Doreatha Martin time: ____________ Date: ____________  Movements: ____________ Start time: ____________ Doreatha Martin time: ____________ Date: ____________ Movements: ____________ Start time: ____________ Doreatha Martin time: ____________ Date: ____________ Movements: ____________ Start time: ____________ Doreatha Martin time: ____________ Date: ____________ Movements: ____________ Start time: ____________ Doreatha Martin time: ____________  Date: ____________ Movements: ____________ Start time: ____________ Doreatha Martin time: ____________ Date: ____________ Movements: ____________ Start time: ____________ Doreatha Martin time: ____________ Date: ____________ Movements: ____________ Start time: ____________ Doreatha Martin time: ____________ Date: ____________ Movements: ____________ Start time: ____________ Doreatha Martin time: ____________ Date: ____________ Movements: ____________ Start time: ____________ Doreatha Martin time: ____________ Date: ____________ Movements: ____________ Start time: ____________ Doreatha Martin time: ____________ Date: ____________ Movements: ____________ Start time: ____________ Doreatha Martin time: ____________  Date: ____________ Movements: ____________ Start time: ____________ Doreatha Martin time: ____________ Date: ____________ Movements: ____________ Start time: ____________ Doreatha Martin time: ____________ Date: ____________ Movements: ____________ Start time: ____________ Doreatha Martin time: ____________ Date: ____________ Movements: ____________ Start time: ____________ Doreatha Martin time: ____________ Date: ____________ Movements: ____________ Start time: ____________ Doreatha Martin time: ____________ Date: ____________ Movements: ____________ Start time: ____________ Doreatha Martin time: ____________ Date: ____________ Movements: ____________ Start time: ____________ Doreatha Martin time: ____________  Date: ____________ Movements: ____________ Start time: ____________ Doreatha Martin time: ____________ Date: ____________ Movements: ____________ Start time: ____________ Doreatha Martin time: ____________ Date: ____________ Movements: ____________ Start time:  ____________ Doreatha Martin time: ____________ Date: ____________ Movements: ____________ Start time: ____________ Doreatha Martin time: ____________ Date: ____________ Movements: ____________ Start time: ____________ Doreatha Martin time: ____________ Date: ____________ Movements: ____________ Start time: ____________ Doreatha Martin time: ____________ Date: ____________ Movements: ____________ Start time: ____________ Doreatha Martin time: ____________  Date: ____________ Movements: ____________ Start time: ____________ Doreatha Martin time: ____________ Date: ____________ Movements: ____________ Start time: ____________ Doreatha Martin time: ____________ Date: ____________ Movements: ____________ Start time: ____________ Doreatha Martin time: ____________ Date: ____________ Movements: ____________ Start time: ____________ Doreatha Martin time: ____________ Date: ____________ Movements: ____________ Start time: ____________ Doreatha Martin time: ____________ Date: ____________ Movements: ____________ Start time: ____________ Doreatha Martin time: ____________ Date: ____________ Movements: ____________ Start time: ____________ Doreatha Martin time: ____________  Date: ____________ Movements: ____________ Start time: ____________ Doreatha Martin time: ____________ Date: ____________ Movements: ____________ Start time: ____________ Doreatha Martin time: ____________ Date: ____________ Movements: ____________ Start time: ____________ Doreatha Martin time: ____________ Date: ____________ Movements: ____________ Start time: ____________ Doreatha Martin time: ____________ Date: ____________ Movements: ____________ Start time: ____________ Doreatha Martin time: ____________ Date: ____________ Movements: ____________ Start time: ____________ Doreatha Martin time: ____________   This information is not intended to replace advice given to you by your health care provider. Make sure you discuss any questions you have with your health care provider.   Document Released: 12/26/2006 Document Revised: 12/17/2014 Document Reviewed: 09/22/2012 Elsevier Interactive  Patient Education [employer]

## 2016-04-11 ENCOUNTER — Encounter: Payer: Medicaid Other | Admitting: Student

## 2016-04-17 ENCOUNTER — Encounter: Payer: Medicaid Other | Admitting: Advanced Practice Midwife

## 2016-04-17 LAB — POCT URINALYSIS DIP (DEVICE)
Bilirubin Urine: NEGATIVE
Glucose, UA: NEGATIVE mg/dL
HGB URINE DIPSTICK: NEGATIVE
Ketones, ur: NEGATIVE mg/dL
LEUKOCYTES UA: NEGATIVE
Nitrite: NEGATIVE
PH: 6 (ref 5.0–8.0)
PROTEIN: 30 mg/dL — AB
SPECIFIC GRAVITY, URINE: 1.02 (ref 1.005–1.030)
UROBILINOGEN UA: 0.2 mg/dL (ref 0.0–1.0)

## 2016-04-18 ENCOUNTER — Ambulatory Visit (INDEPENDENT_AMBULATORY_CARE_PROVIDER_SITE_OTHER): Payer: Medicaid Other | Admitting: Certified Nurse Midwife

## 2016-04-18 ENCOUNTER — Other Ambulatory Visit (HOSPITAL_COMMUNITY)
Admission: RE | Admit: 2016-04-18 | Discharge: 2016-04-18 | Disposition: A | Discharge status: Home / Self Care | Payer: Medicaid Other | Source: Ambulatory Visit | Attending: Student | Admitting: Student

## 2016-04-18 VITALS — BP 114/63 | HR 82 | Wt 200.4 lb

## 2016-04-18 DIAGNOSIS — Z1151 Encounter for screening for human papillomavirus (HPV): Secondary | ICD-10-CM | POA: Insufficient documentation

## 2016-04-18 DIAGNOSIS — Z01419 Encounter for gynecological examination (general) (routine) without abnormal findings: Secondary | ICD-10-CM | POA: Diagnosis present

## 2016-04-18 DIAGNOSIS — D649 Anemia, unspecified: Secondary | ICD-10-CM

## 2016-04-18 DIAGNOSIS — O99013 Anemia complicating pregnancy, third trimester: Secondary | ICD-10-CM

## 2016-04-18 DIAGNOSIS — O0993 Supervision of high risk pregnancy, unspecified, third trimester: Secondary | ICD-10-CM

## 2016-04-18 LAB — POCT URINALYSIS DIP (DEVICE)
BILIRUBIN URINE: NEGATIVE
GLUCOSE, UA: NEGATIVE mg/dL
Hgb urine dipstick: NEGATIVE
KETONES UR: NEGATIVE mg/dL
LEUKOCYTES UA: NEGATIVE
Nitrite: NEGATIVE
Protein, ur: NEGATIVE mg/dL
SPECIFIC GRAVITY, URINE: 1.01 (ref 1.005–1.030)
Urobilinogen, UA: 0.2 mg/dL (ref 0.0–1.0)
pH: 6 (ref 5.0–8.0)

## 2016-04-18 LAB — OB RESULTS CONSOLE GBS: GBS: NEGATIVE

## 2016-04-18 NOTE — Patient Instructions (Signed)
Group B streptococcus (GBS) is a type of bacteria often found in healthy women. GBS is not the same as the bacteria that causes strep throat. You may have GBS in your vagina, rectum, or bladder. GBS does not spread through sexual contact, but it can be passed to a baby during childbirth. This can be dangerous for your baby. It is not dangerous to you and usually does not cause any symptoms. Your health care provider may test you for GBS when your pregnancy is between 35 and 37 weeks. GBS is dangerous only during birth, so there is no need to test for it earlier. It is possible to have GBS during pregnancy and never pass it to your baby. If your test results are positive for GBS, your health care provider may recommend giving you antibiotic medicine during delivery to make sure your baby stays healthy. RISK FACTORS You are more likely to pass GBS to your baby if:   Your water breaks (ruptured membrane) or you go into labor before 37 weeks.  Your water breaks 18 hours before you deliver.  You passed GBS during a previous pregnancy.  You have a urinary tract infection caused by GBS any time during pregnancy.  You have a fever during labor. SYMPTOMS Most women who have GBS do not have any symptoms. If you have a urinary tract infection caused by GBS, you might have frequent or painful urination and fever. Babies who get GBS usually show symptoms within 7 days of birth. Symptoms may include:   Breathing problems.  Heart and blood pressure problems.  Digestive and kidney problems. DIAGNOSIS Routine screening for GBS is recommended for all pregnant women. A health care provider takes a sample of the fluid in your vagina and rectum with a swab. It is then sent to a lab to be checked for GBS. A sample of your urine may also be checked for the bacteria.  TREATMENT If you test positive for GBS, you may need treatment with an antibiotic medicine during labor. As soon as you go into labor, or as soon as  your membranes rupture, you will get the antibiotic medicine through an IV access. You will continue to get the medicine until after you give birth. You do not need antibiotic medicine if you are having a cesarean delivery.If your baby shows signs or symptoms of GBS after birth, your baby can also be treated with an antibiotic medicine. HOME CARE INSTRUCTIONS   Take all antibiotic medicine as prescribed by your health care provider. Only take medicine as directed.   Continue with prenatal visits and care.   Keep all follow-up appointments.  SEEK MEDICAL CARE IF:   You have pain when you urinate.   You have to urinate frequently.   You have a fever.  SEEK IMMEDIATE MEDICAL CARE IF:   Your membranes rupture.  You go into labor.   This information is not intended to replace advice given to you by your health care provider. Make sure you discuss any questions you have with your health care provider.   Document Released: 03/04/2008 Document Revised: 12/01/2013 Document Reviewed: 09/18/2013 Elsevier Interactive Patient Education 2016 [employer] Inc[employer]  

## 2016-04-18 NOTE — Progress Notes (Signed)
Subjective:  Veronica Torres is a 30 y.o. G2P0101 at 2420w3d being seen today for ongoing prenatal care.  She is currently monitored for the following issues for this low-risk pregnancy and has History of preterm delivery, currently pregnant; Supervision of high-risk pregnancy; and Anemia of mother in pregnancy, antepartum on her problem list.  Patient reports no complaints.  Contractions: Not present. Vag. Bleeding: None.  Movement: Present. Denies leaking of fluid.   The following portions of the patient's history were reviewed and updated as appropriate: allergies, current medications, past family history, past medical history, past social history, past surgical history and problem list. Problem list updated.  Objective:   Filed Vitals:   04/18/16 1110  BP: 114/63  Pulse: 82  Weight: 200 lb 6.4 oz (90.901 kg)    Fetal Status: Fetal Heart Rate (bpm): 144   Movement: Present     General:  Alert, oriented and cooperative. Patient is in no acute distress.  Skin: Skin is warm and dry. No rash noted.   Cardiovascular: Normal heart rate noted  Respiratory: Normal respiratory effort, no problems with respiration noted  Abdomen: Soft, gravid, appropriate for gestational age. Pain/Pressure: Present     Pelvic: Vag. Bleeding: None     Cervical exam performed      cl/th high  Extremities: Normal range of motion.  Edema: None  Mental Status: Normal mood and affect. Normal behavior. Normal judgment and thought content.   Urinalysis: Urine Protein: Negative Urine Glucose: Negative  Assessment and Plan:  Pregnancy: G2P0101 at 5820w3d  1. Anemia of mother in pregnancy, antepartum, third trimester  - GC/Chlamydia probe amp ([facility] - Culture, beta strep (group b only)  2. Supervision of high-risk pregnancy, third trimester   Term labor symptoms and general obstetric precautions including but not limited to vaginal bleeding, contractions, leaking of fluid and fetal movement were  reviewed in detail with the patient. Please refer to After Visit Summary for other counseling recommendations.  No Follow-up on file.   Rhea PinkLori A Dashay Giesler, CNM

## 2016-04-19 LAB — GC/CHLAMYDIA PROBE AMP (~~LOC~~) NOT AT ARMC
Chlamydia: NEGATIVE
Neisseria Gonorrhea: NEGATIVE

## 2016-04-20 LAB — CULTURE, BETA STREP (GROUP B ONLY)

## 2016-04-24 ENCOUNTER — Ambulatory Visit (INDEPENDENT_AMBULATORY_CARE_PROVIDER_SITE_OTHER): Payer: Medicaid Other | Admitting: Advanced Practice Midwife

## 2016-04-24 VITALS — BP 115/75 | HR 72 | Wt 202.0 lb

## 2016-04-24 DIAGNOSIS — O99013 Anemia complicating pregnancy, third trimester: Secondary | ICD-10-CM

## 2016-04-24 DIAGNOSIS — D649 Anemia, unspecified: Secondary | ICD-10-CM

## 2016-04-24 DIAGNOSIS — O0993 Supervision of high risk pregnancy, unspecified, third trimester: Secondary | ICD-10-CM

## 2016-04-24 DIAGNOSIS — O09213 Supervision of pregnancy with history of pre-term labor, third trimester: Secondary | ICD-10-CM | POA: Diagnosis present

## 2016-04-24 LAB — POCT URINALYSIS DIP (DEVICE)
BILIRUBIN URINE: NEGATIVE
Glucose, UA: NEGATIVE mg/dL
HGB URINE DIPSTICK: NEGATIVE
KETONES UR: NEGATIVE mg/dL
Leukocytes, UA: NEGATIVE
Nitrite: NEGATIVE
PH: 6 (ref 5.0–8.0)
PROTEIN: 30 mg/dL — AB
Specific Gravity, Urine: 1.01 (ref 1.005–1.030)
Urobilinogen, UA: 0.2 mg/dL (ref 0.0–1.0)

## 2016-04-24 NOTE — Progress Notes (Signed)
Subjective:  Veronica Torres is a 30 y.o. G2P0101 at 3263w2d being seen today for ongoing prenatal care.  She is currently monitored for the following issues for this high-risk pregnancy and has History of preterm delivery, currently pregnant; Supervision of high-risk pregnancy; and Anemia of mother in pregnancy, antepartum on her problem list.  Patient reports no complaints.  Contractions: Irritability. Vag. Bleeding: None.  Movement: Present. Denies leaking of fluid.   The following portions of the patient's history were reviewed and updated as appropriate: allergies, current medications, past family history, past medical history, past social history, past surgical history and problem list. Problem list updated.  Objective:   Filed Vitals:   04/24/16 1516  BP: 115/75  Pulse: 72  Weight: 202 lb (91.627 kg)    Fetal Status: Fetal Heart Rate (bpm): 143   Movement: Present     General:  Alert, oriented and cooperative. Patient is in no acute distress.  Skin: Skin is warm and dry. No rash noted.   Cardiovascular: Normal heart rate noted  Respiratory: Normal respiratory effort, no problems with respiration noted  Abdomen: Soft, gravid, appropriate for gestational age. Pain/Pressure: Present     Pelvic: Vag. Bleeding: None     Cervical exam performed Dilation: 1 Effacement (%): Thick Station: -3  Extremities: Normal range of motion.  Edema: None  Mental Status: Normal mood and affect. Normal behavior. Normal judgment and thought content.   Urinalysis: Urine Protein: 1+ Urine Glucose: Negative  Assessment and Plan:  Pregnancy: G2P0101 at 5363w2d  1. Supervision of high-risk pregnancy, third trimester --Hx of preterm labor/delivery.    Term labor symptoms and general obstetric precautions including but not limited to vaginal bleeding, contractions, leaking of fluid and fetal movement were reviewed in detail with the patient. Please refer to After Visit Summary for other counseling  recommendations.  Return in about 1 week (around 05/01/2016).   Hurshel PartyLisa A Leftwich-Kirby, CNM

## 2016-04-24 NOTE — Progress Notes (Signed)
Tip of week discussed with patient  

## 2016-05-03 ENCOUNTER — Ambulatory Visit (INDEPENDENT_AMBULATORY_CARE_PROVIDER_SITE_OTHER): Payer: Medicaid Other | Admitting: Family Medicine

## 2016-05-03 VITALS — BP 112/67 | HR 68 | Wt 198.9 lb

## 2016-05-03 DIAGNOSIS — O09213 Supervision of pregnancy with history of pre-term labor, third trimester: Secondary | ICD-10-CM | POA: Diagnosis present

## 2016-05-03 DIAGNOSIS — O09893 Supervision of other high risk pregnancies, third trimester: Secondary | ICD-10-CM

## 2016-05-03 DIAGNOSIS — O0993 Supervision of high risk pregnancy, unspecified, third trimester: Secondary | ICD-10-CM

## 2016-05-03 LAB — POCT URINALYSIS DIP (DEVICE)
Bilirubin Urine: NEGATIVE
Glucose, UA: NEGATIVE mg/dL
HGB URINE DIPSTICK: NEGATIVE
Ketones, ur: NEGATIVE mg/dL
LEUKOCYTES UA: NEGATIVE
Nitrite: NEGATIVE
PH: 6.5 (ref 5.0–8.0)
Protein, ur: NEGATIVE mg/dL
SPECIFIC GRAVITY, URINE: 1.01 (ref 1.005–1.030)
UROBILINOGEN UA: 0.2 mg/dL (ref 0.0–1.0)

## 2016-05-03 NOTE — Progress Notes (Signed)
Subjective:  Veronica Torres is a 30 y.o. G2P0101 at 7936w4d being seen today for ongoing prenatal care.  She is currently monitored for the following issues for this high-risk pregnancy and has History of preterm delivery, currently pregnant; Supervision of high-risk pregnancy; and Anemia of mother in pregnancy, antepartum on her problem list.  Patient reports no complaints.  Contractions: Not present.  .  Movement: Present. Denies leaking of fluid.   The following portions of the patient's history were reviewed and updated as appropriate: allergies, current medications, past family history, past medical history, past social history, past surgical history and problem list. Problem list updated.  Objective:   Filed Vitals:   05/03/16 1022  BP: 112/67  Pulse: 68  Weight: 198 lb 14.4 oz (90.22 kg)    Fetal Status: Fetal Heart Rate (bpm): 147   Movement: Present  Presentation: Vertex  General:  Alert, oriented and cooperative. Patient is in no acute distress.  Skin: Skin is warm and dry. No rash noted.   Cardiovascular: Normal heart rate noted  Respiratory: Normal respiratory effort, no problems with respiration noted  Abdomen: Soft, gravid, appropriate for gestational age. Pain/Pressure: Present     Pelvic:       Cervical exam performed Dilation: 1 Effacement (%): Thick Station: Ballotable  Extremities: Normal range of motion.     Mental Status: Normal mood and affect. Normal behavior. Normal judgment and thought content.   Urinalysis: Urine Protein: Negative Urine Glucose: Negative  Assessment and Plan:  Pregnancy: G2P0101 at 5436w4d  1. Supervision of high-risk pregnancy, third trimester FHT and FH normal.    2. History of preterm delivery, currently pregnant, third trimester   Term labor symptoms and general obstetric precautions including but not limited to vaginal bleeding, contractions, leaking of fluid and fetal movement were reviewed in detail with the patient. Please refer to  After Visit Summary for other counseling recommendations.  No Follow-up on file.   Levie HeritageJacob J Stinson, DO

## 2016-05-08 ENCOUNTER — Encounter (HOSPITAL_COMMUNITY): Payer: Self-pay | Admitting: *Deleted

## 2016-05-08 ENCOUNTER — Inpatient Hospital Stay (HOSPITAL_COMMUNITY): Payer: Medicaid Other

## 2016-05-08 ENCOUNTER — Inpatient Hospital Stay (HOSPITAL_COMMUNITY)
Admission: AD | Admit: 2016-05-08 | Discharge: 2016-05-08 | Disposition: A | Discharge status: Home / Self Care | Payer: Medicaid Other | Source: Ambulatory Visit | Attending: Obstetrics and Gynecology | Admitting: Obstetrics and Gynecology

## 2016-05-08 DIAGNOSIS — O368130 Decreased fetal movements, third trimester, not applicable or unspecified: Secondary | ICD-10-CM | POA: Insufficient documentation

## 2016-05-08 DIAGNOSIS — Z3A40 40 weeks gestation of pregnancy: Secondary | ICD-10-CM | POA: Diagnosis not present

## 2016-05-08 DIAGNOSIS — O368190 Decreased fetal movements, unspecified trimester, not applicable or unspecified: Secondary | ICD-10-CM

## 2016-05-08 DIAGNOSIS — Z3A41 41 weeks gestation of pregnancy: Secondary | ICD-10-CM

## 2016-05-08 LAB — URINALYSIS, ROUTINE W REFLEX MICROSCOPIC
BILIRUBIN URINE: NEGATIVE
Glucose, UA: NEGATIVE mg/dL
Hgb urine dipstick: NEGATIVE
Ketones, ur: NEGATIVE mg/dL
Leukocytes, UA: NEGATIVE
NITRITE: NEGATIVE
PROTEIN: NEGATIVE mg/dL
pH: 6.5 (ref 5.0–8.0)

## 2016-05-08 NOTE — MAU Provider Note (Signed)
MAU HISTORY AND PHYSICAL  Chief Complaint:  Decreased fetal movement  Veronica Torres is a 30 y.o.  G2P0101 with IUP at 7355w2d presenting for decreased fetal movement.   Last night emesis and loose stools. Emesis resolved this morning, tolerating po. No abdominal pain. Since last night decreased fetal movement. Has felt baby move. No LOF, no vaginal bleeding. No fevers or chills. Occasional moderate contractions.   Past Medical History  Diagnosis Date  . Medical history non-contributory     Past Surgical History  Procedure Laterality Date  . No past surgeries    . Tooth extraction      History reviewed. No pertinent family history.  Social History  Substance Use Topics  . Smoking status: Never Smoker   . Smokeless tobacco: None  . Alcohol Use: No    No Known Allergies  Prescriptions prior to admission  Medication Sig Dispense Refill Last Dose  . Iron-FA-B Cmp-C-Biot-Probiotic (FUSION PLUS) CAPS Take 1 capsule by mouth daily. 30 capsule 6 05/07/2016 at Unknown time  . Prenatal Vit-Fe Fumarate-FA (MULTIVITAMIN-PRENATAL) 27-0.8 MG TABS tablet Take 1 tablet by mouth daily at 12 noon.    05/07/2016 at Unknown time    Review of Systems - Negative except for what is mentioned in HPI.  Physical Exam  Blood pressure 133/81, pulse 85, temperature 98.1 F (36.7 C), resp. rate 18, height 5\' 4"  (1.626 m), weight 198 lb (89.812 kg), last menstrual period 07/31/2015. GENERAL: Well-developed, well-nourished female in no acute distress.  LUNGS: Clear to auscultation bilaterally.  HEART: Regular rate and rhythm. ABDOMEN: Soft, nontender, nondistended, gravid.  EXTREMITIES: Nontender, no edema, 2+ distal pulses. Cervical Exam: deferred Presentation: cephalic FHT:  140/mod/+a/-d Contractions: q 10 min   Labs: Results for orders placed or performed during the hospital encounter of 05/08/16 (from the past 24 hour(s))  Urinalysis, Routine w reflex microscopic (not [facility])   Collection Time:  05/08/16 11:20 AM  Result Value Ref Range   Color, Urine YELLOW YELLOW   APPearance CLEAR CLEAR   Specific Gravity, Urine <1.005 (L) 1.005 - 1.030   pH 6.5 5.0 - 8.0   Glucose, UA NEGATIVE NEGATIVE mg/dL   Hgb urine dipstick NEGATIVE NEGATIVE   Bilirubin Urine NEGATIVE NEGATIVE   Ketones, ur NEGATIVE NEGATIVE mg/dL   Protein, ur NEGATIVE NEGATIVE mg/dL   Nitrite NEGATIVE NEGATIVE   Leukocytes, UA NEGATIVE NEGATIVE    Imaging Studies:  No results found.  Assessment: Veronica Torres is  30 y.o. G2P0101 at 4755w2d presents with decreased fetal movement. Essentially returned to baseline by time of exam. NST reactive. Bedside u/s with borderline AFI of 6.04, bpp obtained which was 8/8 with normal afi of 11s. No symptoms prom. Emesis from last night has resolved and does not appear dehydrated on exam. No signs abruption.   Plan: - kick counts - ob f/u this week as scheduled - ptl, prom, and abruption return precautions  Cherrie Gauzeoah B Edvardo Honse 5/30/20171:14 PM

## 2016-05-08 NOTE — MAU Note (Signed)
Pt presents to MAU with complaints of lower abdominal cramping, nausea, vomiting and diarrhea since last night. Reports a decrease in fetal movement since 4 am. Denies any vaginal bleeding or LOF

## 2016-05-08 NOTE — Progress Notes (Signed)
Dr Ashok PallWouk notified of pt's admission and status. Will see pt

## 2016-05-08 NOTE — Progress Notes (Signed)
Dr Ashok PallWouk notified of BPP 8/8. Pt stable for d/c home

## 2016-05-08 NOTE — MAU Note (Signed)
Dr Ashok PallWouk in with bedside u/s. OK for pt to stay off EFM but would like BPP. Pt up to BR

## 2016-05-08 NOTE — Discharge Instructions (Signed)
Fetal Movement Counts  Patient Name: __________________________________________________ Patient Due Date: ____________________  Performing a fetal movement count is highly recommended in high-risk pregnancies, but it is good for every pregnant woman to do. Your health care provider may ask you to start counting fetal movements at 28 weeks of the pregnancy. Fetal movements often increase:  · After eating a full meal.  · After physical activity.  · After eating or drinking something sweet or cold.  · At rest.  Pay attention to when you feel the baby is most active. This will help you notice a pattern of your baby's sleep and wake cycles and what factors contribute to an increase in fetal movement. It is important to perform a fetal movement count at the same time each day when your baby is normally most active.   HOW TO COUNT FETAL MOVEMENTS  1. Find a quiet and comfortable area to sit or lie down on your left side. Lying on your left side provides the best blood and oxygen circulation to your baby.  2. Write down the day and time on a sheet of paper or in a journal.  3. Start counting kicks, flutters, swishes, rolls, or jabs in a 2-hour period. You should feel at least 10 movements within 2 hours.  4. If you do not feel 10 movements in 2 hours, wait 2-3 hours and count again. Look for a change in the pattern or not enough counts in 2 hours.  SEEK MEDICAL CARE IF:  · You feel less than 10 counts in 2 hours, tried twice.  · There is no movement in over an hour.  · The pattern is changing or taking longer each day to reach 10 counts in 2 hours.  · You feel the baby is not moving as he or she usually does.  Date: ____________ Movements: ____________ Start time: ____________ Finish time: ____________   Date: ____________ Movements: ____________ Start time: ____________ Finish time: ____________  Date: ____________ Movements: ____________ Start time: ____________ Finish time: ____________  Date: ____________ Movements:  ____________ Start time: ____________ Finish time: ____________  Date: ____________ Movements: ____________ Start time: ____________ Finish time: ____________  Date: ____________ Movements: ____________ Start time: ____________ Finish time: ____________  Date: ____________ Movements: ____________ Start time: ____________ Finish time: ____________  Date: ____________ Movements: ____________ Start time: ____________ Finish time: ____________   Date: ____________ Movements: ____________ Start time: ____________ Finish time: ____________  Date: ____________ Movements: ____________ Start time: ____________ Finish time: ____________  Date: ____________ Movements: ____________ Start time: ____________ Finish time: ____________  Date: ____________ Movements: ____________ Start time: ____________ Finish time: ____________  Date: ____________ Movements: ____________ Start time: ____________ Finish time: ____________  Date: ____________ Movements: ____________ Start time: ____________ Finish time: ____________  Date: ____________ Movements: ____________ Start time: ____________ Finish time: ____________   Date: ____________ Movements: ____________ Start time: ____________ Finish time: ____________  Date: ____________ Movements: ____________ Start time: ____________ Finish time: ____________  Date: ____________ Movements: ____________ Start time: ____________ Finish time: ____________  Date: ____________ Movements: ____________ Start time: ____________ Finish time: ____________  Date: ____________ Movements: ____________ Start time: ____________ Finish time: ____________  Date: ____________ Movements: ____________ Start time: ____________ Finish time: ____________  Date: ____________ Movements: ____________ Start time: ____________ Finish time: ____________   Date: ____________ Movements: ____________ Start time: ____________ Finish time: ____________  Date: ____________ Movements: ____________ Start time: ____________ Finish  time: ____________  Date: ____________ Movements: ____________ Start time: ____________ Finish time: ____________  Date: ____________ Movements: ____________ Start time:   ____________ Finish time: ____________  Date: ____________ Movements: ____________ Start time: ____________ Finish time: ____________  Date: ____________ Movements: ____________ Start time: ____________ Finish time: ____________  Date: ____________ Movements: ____________ Start time: ____________ Finish time: ____________   Date: ____________ Movements: ____________ Start time: ____________ Finish time: ____________  Date: ____________ Movements: ____________ Start time: ____________ Finish time: ____________  Date: ____________ Movements: ____________ Start time: ____________ Finish time: ____________  Date: ____________ Movements: ____________ Start time: ____________ Finish time: ____________  Date: ____________ Movements: ____________ Start time: ____________ Finish time: ____________  Date: ____________ Movements: ____________ Start time: ____________ Finish time: ____________  Date: ____________ Movements: ____________ Start time: ____________ Finish time: ____________   Date: ____________ Movements: ____________ Start time: ____________ Finish time: ____________  Date: ____________ Movements: ____________ Start time: ____________ Finish time: ____________  Date: ____________ Movements: ____________ Start time: ____________ Finish time: ____________  Date: ____________ Movements: ____________ Start time: ____________ Finish time: ____________  Date: ____________ Movements: ____________ Start time: ____________ Finish time: ____________  Date: ____________ Movements: ____________ Start time: ____________ Finish time: ____________  Date: ____________ Movements: ____________ Start time: ____________ Finish time: ____________   Date: ____________ Movements: ____________ Start time: ____________ Finish time: ____________  Date: ____________  Movements: ____________ Start time: ____________ Finish time: ____________  Date: ____________ Movements: ____________ Start time: ____________ Finish time: ____________  Date: ____________ Movements: ____________ Start time: ____________ Finish time: ____________  Date: ____________ Movements: ____________ Start time: ____________ Finish time: ____________  Date: ____________ Movements: ____________ Start time: ____________ Finish time: ____________  Date: ____________ Movements: ____________ Start time: ____________ Finish time: ____________   Date: ____________ Movements: ____________ Start time: ____________ Finish time: ____________  Date: ____________ Movements: ____________ Start time: ____________ Finish time: ____________  Date: ____________ Movements: ____________ Start time: ____________ Finish time: ____________  Date: ____________ Movements: ____________ Start time: ____________ Finish time: ____________  Date: ____________ Movements: ____________ Start time: ____________ Finish time: ____________  Date: ____________ Movements: ____________ Start time: ____________ Finish time: ____________     This information is not intended to replace advice given to you by your health care provider. Make sure you discuss any questions you have with your health care provider.     Document Released: 12/26/2006 Document Revised: 12/17/2014 Document Reviewed: 09/22/2012  Elsevier Interactive Patient Education ©2016 [employer]

## 2016-05-08 NOTE — Progress Notes (Signed)
Dr Ashok PallWouk on unit.

## 2016-05-08 NOTE — Progress Notes (Signed)
Pt requested sve before leaving

## 2016-05-08 NOTE — Progress Notes (Signed)
Written and verbal d/c instructions given and understanding voiced. 

## 2016-05-09 ENCOUNTER — Telehealth: Payer: Self-pay | Admitting: General Practice

## 2016-05-09 ENCOUNTER — Inpatient Hospital Stay (HOSPITAL_COMMUNITY)
Admission: AD | Admit: 2016-05-09 | Discharge: 2016-05-12 | DRG: 775 | Disposition: A | Discharge status: Home / Self Care | Payer: Medicaid Other | Source: Ambulatory Visit | Attending: Obstetrics & Gynecology | Admitting: Obstetrics & Gynecology

## 2016-05-09 ENCOUNTER — Inpatient Hospital Stay (HOSPITAL_COMMUNITY)
Admission: AD | Admit: 2016-05-09 | Discharge: 2016-05-09 | Disposition: A | Discharge status: Home / Self Care | Payer: Medicaid Other | Source: Ambulatory Visit | Attending: Obstetrics & Gynecology | Admitting: Obstetrics & Gynecology

## 2016-05-09 ENCOUNTER — Encounter (HOSPITAL_COMMUNITY): Payer: Self-pay | Admitting: *Deleted

## 2016-05-09 DIAGNOSIS — Z3A40 40 weeks gestation of pregnancy: Secondary | ICD-10-CM

## 2016-05-09 DIAGNOSIS — O0990 Supervision of high risk pregnancy, unspecified, unspecified trimester: Secondary | ICD-10-CM

## 2016-05-09 DIAGNOSIS — O09899 Supervision of other high risk pregnancies, unspecified trimester: Secondary | ICD-10-CM

## 2016-05-09 DIAGNOSIS — O09219 Supervision of pregnancy with history of pre-term labor, unspecified trimester: Secondary | ICD-10-CM

## 2016-05-09 DIAGNOSIS — O0993 Supervision of high risk pregnancy, unspecified, third trimester: Secondary | ICD-10-CM

## 2016-05-09 DIAGNOSIS — O99019 Anemia complicating pregnancy, unspecified trimester: Secondary | ICD-10-CM | POA: Diagnosis present

## 2016-05-09 DIAGNOSIS — O34219 Maternal care for unspecified type scar from previous cesarean delivery: Secondary | ICD-10-CM

## 2016-05-09 DIAGNOSIS — O9902 Anemia complicating childbirth: Principal | ICD-10-CM | POA: Diagnosis present

## 2016-05-09 DIAGNOSIS — D649 Anemia, unspecified: Secondary | ICD-10-CM | POA: Diagnosis present

## 2016-05-09 DIAGNOSIS — O99013 Anemia complicating pregnancy, third trimester: Secondary | ICD-10-CM

## 2016-05-09 DIAGNOSIS — IMO0001 Reserved for inherently not codable concepts without codable children: Secondary | ICD-10-CM

## 2016-05-09 NOTE — Discharge Instructions (Signed)
Fetal Movement Counts Patient Name: __________________________________________________ Patient Due Date: ____________________ Performing a fetal movement count is highly recommended in high-risk pregnancies, but it is good for every pregnant woman to do. Your health care provider may ask you to start counting fetal movements at 28 weeks of the pregnancy. Fetal movements often increase:  After eating a full meal.  After physical activity.  After eating or drinking something sweet or cold.  At rest. Pay attention to when you feel the baby is most active. This will help you notice a pattern of your baby's sleep and wake cycles and what factors contribute to an increase in fetal movement. It is important to perform a fetal movement count at the same time each day when your baby is normally most active.  HOW TO COUNT FETAL MOVEMENTS 1. Find a quiet and comfortable area to sit or lie down on your left side. Lying on your left side provides the best blood and oxygen circulation to your baby. 2. Write down the day and time on a sheet of paper or in a journal. 3. Start counting kicks, flutters, swishes, rolls, or jabs in a 2-hour period. You should feel at least 10 movements within 2 hours. 4. If you do not feel 10 movements in 2 hours, wait 2-3 hours and count again. Look for a change in the pattern or not enough counts in 2 hours. SEEK MEDICAL CARE IF:  You feel less than 10 counts in 2 hours, tried twice.  There is no movement in over an hour.  The pattern is changing or taking longer each day to reach 10 counts in 2 hours.  You feel the baby is not moving as he or she usually does. Date: ____________ Movements: ____________ Start time: ____________ Finish time: ____________  Date: ____________ Movements: ____________ Start time: ____________ Finish time: ____________ Date: ____________ Movements: ____________ Start time: ____________ Finish time: ____________ Date: ____________ Movements:  ____________ Start time: ____________ Finish time: ____________ Date: ____________ Movements: ____________ Start time: ____________ Finish time: ____________ Date: ____________ Movements: ____________ Start time: ____________ Finish time: ____________ Date: ____________ Movements: ____________ Start time: ____________ Finish time: ____________ Date: ____________ Movements: ____________ Start time: ____________ Finish time: ____________  Date: ____________ Movements: ____________ Start time: ____________ Finish time: ____________ Date: ____________ Movements: ____________ Start time: ____________ Finish time: ____________ Date: ____________ Movements: ____________ Start time: ____________ Finish time: ____________ Date: ____________ Movements: ____________ Start time: ____________ Finish time: ____________ Date: ____________ Movements: ____________ Start time: ____________ Finish time: ____________ Date: ____________ Movements: ____________ Start time: ____________ Finish time: ____________ Date: ____________ Movements: ____________ Start time: ____________ Finish time: ____________  Date: ____________ Movements: ____________ Start time: ____________ Finish time: ____________ Date: ____________ Movements: ____________ Start time: ____________ Finish time: ____________ Date: ____________ Movements: ____________ Start time: ____________ Finish time: ____________ Date: ____________ Movements: ____________ Start time: ____________ Finish time: ____________ Date: ____________ Movements: ____________ Start time: ____________ Finish time: ____________ Date: ____________ Movements: ____________ Start time: ____________ Finish time: ____________ Date: ____________ Movements: ____________ Start time: ____________ Finish time: ____________  Date: ____________ Movements: ____________ Start time: ____________ Finish time: ____________ Date: ____________ Movements: ____________ Start time: ____________ Finish  time: ____________ Date: ____________ Movements: ____________ Start time: ____________ Finish time: ____________ Date: ____________ Movements: ____________ Start time: ____________ Finish time: ____________ Date: ____________ Movements: ____________ Start time: ____________ Finish time: ____________ Date: ____________ Movements: ____________ Start time: ____________ Finish time: ____________ Date: ____________ Movements: ____________ Start time: ____________ Finish time: ____________  Date: ____________ Movements: ____________ Start time: ____________ Finish   time: ____________ Date: ____________ Movements: ____________ Start time: ____________ Finish time: ____________ Date: ____________ Movements: ____________ Start time: ____________ Finish time: ____________ Date: ____________ Movements: ____________ Start time: ____________ Finish time: ____________ Date: ____________ Movements: ____________ Start time: ____________ Finish time: ____________ Date: ____________ Movements: ____________ Start time: ____________ Finish time: ____________ Date: ____________ Movements: ____________ Start time: ____________ Finish time: ____________  Date: ____________ Movements: ____________ Start time: ____________ Finish time: ____________ Date: ____________ Movements: ____________ Start time: ____________ Finish time: ____________ Date: ____________ Movements: ____________ Start time: ____________ Finish time: ____________ Date: ____________ Movements: ____________ Start time: ____________ Finish time: ____________ Date: ____________ Movements: ____________ Start time: ____________ Finish time: ____________ Date: ____________ Movements: ____________ Start time: ____________ Finish time: ____________ Date: ____________ Movements: ____________ Start time: ____________ Finish time: ____________  Date: ____________ Movements: ____________ Start time: ____________ Finish time: ____________ Date: ____________  Movements: ____________ Start time: ____________ Finish time: ____________ Date: ____________ Movements: ____________ Start time: ____________ Finish time: ____________ Date: ____________ Movements: ____________ Start time: ____________ Finish time: ____________ Date: ____________ Movements: ____________ Start time: ____________ Finish time: ____________ Date: ____________ Movements: ____________ Start time: ____________ Finish time: ____________ Date: ____________ Movements: ____________ Start time: ____________ Finish time: ____________  Date: ____________ Movements: ____________ Start time: ____________ Finish time: ____________ Date: ____________ Movements: ____________ Start time: ____________ Finish time: ____________ Date: ____________ Movements: ____________ Start time: ____________ Finish time: ____________ Date: ____________ Movements: ____________ Start time: ____________ Finish time: ____________ Date: ____________ Movements: ____________ Start time: ____________ Finish time: ____________ Date: ____________ Movements: ____________ Start time: ____________ Finish time: ____________   This information is not intended to replace advice given to you by your health care provider. Make sure you discuss any questions you have with your health care provider.   Document Released: 12/26/2006 Document Revised: 12/17/2014 Document Reviewed: 09/22/2012 Elsevier Interactive Patient Education 2016 [employer] Braxton Hicks Contractions Contractions of the uterus can occur throughout pregnancy. Contractions are not always a sign that you are in labor.  WHAT ARE BRAXTON HICKS CONTRACTIONS?  Contractions that occur before labor are called Braxton Hicks contractions, or false labor. Toward the end of pregnancy (32-34 weeks), these contractions can develop more often and may become more forceful. This is not true labor because these contractions do not result in opening (dilatation) and thinning of  the cervix. They are sometimes difficult to tell apart from true labor because these contractions can be forceful and people have different pain tolerances. You should not feel embarrassed if you go to the hospital with false labor. Sometimes, the only way to tell if you are in true labor is for your health care provider to look for changes in the cervix. If there are no prenatal problems or other health problems associated with the pregnancy, it is completely safe to be sent home with false labor and await the onset of true labor. HOW CAN YOU TELL THE DIFFERENCE BETWEEN TRUE AND FALSE LABOR? False Labor  The contractions of false labor are usually shorter and not as hard as those of true labor.   The contractions are usually irregular.   The contractions are often felt in the front of the lower abdomen and in the groin.   The contractions may go away when you walk around or change positions while lying down.   The contractions get weaker and are shorter lasting as time goes on.   The contractions do not usually become progressively stronger, regular, and closer together as with true labor.  True Labor 5. Contractions in true   labor last 30-70 seconds, become very regular, usually become more intense, and increase in frequency.  6. The contractions do not go away with walking.  7. The discomfort is usually felt in the top of the uterus and spreads to the lower abdomen and low back.  8. True labor can be determined by your health care provider with an exam. This will show that the cervix is dilating and getting thinner.  WHAT TO REMEMBER  Keep up with your usual exercises and follow other instructions given by your health care provider.   Take medicines as directed by your health care provider.   Keep your regular prenatal appointments.   Eat and drink lightly if you think you are going into labor.   If Braxton Hicks contractions are making you uncomfortable:   Change  your position from lying down or resting to walking, or from walking to resting.   Sit and rest in a tub of warm water.   Drink 2-3 glasses of water. Dehydration may cause these contractions.   Do slow and deep breathing several times an hour.  WHEN SHOULD I SEEK IMMEDIATE MEDICAL CARE? Seek immediate medical care if:  Your contractions become stronger, more regular, and closer together.   You have fluid leaking or gushing from your vagina.   You have a fever.   You pass blood-tinged mucus.   You have vaginal bleeding.   You have continuous abdominal pain.   You have low back pain that you never had before.   You feel your baby's head pushing down and causing pelvic pressure.   Your baby is not moving as much as it used to.    This information is not intended to replace advice given to you by your health care provider. Make sure you discuss any questions you have with your health care provider.   Document Released: 11/26/2005 Document Revised: 12/01/2013 Document Reviewed: 09/07/2013 Elsevier Interactive Patient Education 2016 [employer] Inc[employer]  

## 2016-05-09 NOTE — Telephone Encounter (Signed)
Patient called into front office stating she has been having contractions every 10,15 minutes and was just here yesterday for diarrhea.

## 2016-05-10 ENCOUNTER — Inpatient Hospital Stay (HOSPITAL_COMMUNITY): Payer: Medicaid Other | Admitting: Anesthesiology

## 2016-05-10 ENCOUNTER — Encounter: Payer: Medicaid Other | Admitting: Family

## 2016-05-10 ENCOUNTER — Encounter (HOSPITAL_COMMUNITY): Payer: Self-pay | Admitting: *Deleted

## 2016-05-10 DIAGNOSIS — O34219 Maternal care for unspecified type scar from previous cesarean delivery: Secondary | ICD-10-CM

## 2016-05-10 DIAGNOSIS — D649 Anemia, unspecified: Secondary | ICD-10-CM | POA: Diagnosis present

## 2016-05-10 DIAGNOSIS — O9902 Anemia complicating childbirth: Secondary | ICD-10-CM | POA: Diagnosis present

## 2016-05-10 DIAGNOSIS — IMO0001 Reserved for inherently not codable concepts without codable children: Secondary | ICD-10-CM

## 2016-05-10 DIAGNOSIS — Z3A40 40 weeks gestation of pregnancy: Secondary | ICD-10-CM | POA: Diagnosis not present

## 2016-05-10 LAB — CBC
HCT: 34.5 % — ABNORMAL LOW (ref 36.0–46.0)
HEMOGLOBIN: 11.4 g/dL — AB (ref 12.0–15.0)
MCH: 26.1 pg (ref 26.0–34.0)
MCHC: 33 g/dL (ref 30.0–36.0)
MCV: 79.1 fL (ref 78.0–100.0)
PLATELETS: 205 10*3/uL (ref 150–400)
RBC: 4.36 MIL/uL (ref 3.87–5.11)
RDW: 15.2 % (ref 11.5–15.5)
WBC: 8.8 10*3/uL (ref 4.0–10.5)

## 2016-05-10 LAB — TYPE AND SCREEN
ABO/RH(D): A POS
ANTIBODY SCREEN: NEGATIVE

## 2016-05-10 LAB — RPR: RPR: NONREACTIVE

## 2016-05-10 LAB — ABO/RH: ABO/RH(D): A POS

## 2016-05-10 MED ORDER — LIDOCAINE HCL (PF) 1 % IJ SOLN
INTRAMUSCULAR | Status: DC | PRN
  Administered 2016-05-10 (×2): 6 mL

## 2016-05-10 MED ORDER — OXYCODONE-ACETAMINOPHEN 5-325 MG PO TABS: 1.0000 | ORAL_TABLET | ORAL | Status: DC | PRN

## 2016-05-10 MED ORDER — FENTANYL CITRATE (PF) 100 MCG/2ML IJ SOLN
100.0000 ug | INTRAMUSCULAR | Status: DC | PRN
  Administered 2016-05-10: 100 ug via INTRAVENOUS
  Filled 2016-05-10: qty 2

## 2016-05-10 MED ORDER — DIPHENHYDRAMINE HCL 25 MG PO CAPS: 25.0000 mg | ORAL_CAPSULE | Freq: Four times a day (QID) | ORAL | Status: DC | PRN

## 2016-05-10 MED ORDER — OXYTOCIN 40 UNITS IN LACTATED RINGERS INFUSION - SIMPLE MED: 2.5000 [IU]/h | INTRAVENOUS | Status: DC | PRN

## 2016-05-10 MED ORDER — PHENYLEPHRINE 40 MCG/ML (10ML) SYRINGE FOR IV PUSH (FOR BLOOD PRESSURE SUPPORT)
80.0000 ug | PREFILLED_SYRINGE | INTRAVENOUS | Status: DC | PRN
  Filled 2016-05-10: qty 5

## 2016-05-10 MED ORDER — SOD CITRATE-CITRIC ACID 500-334 MG/5ML PO SOLN: 30.0000 mL | ORAL | Status: DC | PRN

## 2016-05-10 MED ORDER — SENNOSIDES-DOCUSATE SODIUM 8.6-50 MG PO TABS: 1.0000 | ORAL_TABLET | Freq: Every evening | ORAL | Status: DC | PRN

## 2016-05-10 MED ORDER — DIBUCAINE 1 % RE OINT: 1.0000 "application " | TOPICAL_OINTMENT | RECTAL | Status: DC | PRN

## 2016-05-10 MED ORDER — DIPHENHYDRAMINE HCL 50 MG/ML IJ SOLN: 12.5000 mg | INTRAMUSCULAR | Status: DC | PRN

## 2016-05-10 MED ORDER — FLEET ENEMA 7-19 GM/118ML RE ENEM: 1.0000 | ENEMA | RECTAL | Status: DC | PRN

## 2016-05-10 MED ORDER — LACTATED RINGERS IV SOLN
INTRAVENOUS | Status: DC
  Administered 2016-05-10 (×2): via INTRAVENOUS

## 2016-05-10 MED ORDER — WITCH HAZEL-GLYCERIN EX PADS
1.0000 "application " | MEDICATED_PAD | CUTANEOUS | Status: DC | PRN
  Administered 2016-05-12: 1 via TOPICAL

## 2016-05-10 MED ORDER — EPHEDRINE 5 MG/ML INJ
10.0000 mg | INTRAVENOUS | Status: DC | PRN
  Filled 2016-05-10: qty 2

## 2016-05-10 MED ORDER — PRENATAL MULTIVITAMIN CH
1.0000 | ORAL_TABLET | Freq: Every day | ORAL | Status: DC
  Administered 2016-05-11: 1 via ORAL
  Filled 2016-05-10: qty 1

## 2016-05-10 MED ORDER — COCONUT OIL OIL
1.0000 "application " | TOPICAL_OIL | Status: DC | PRN
  Administered 2016-05-10: 1 via TOPICAL
  Filled 2016-05-10: qty 120

## 2016-05-10 MED ORDER — LACTATED RINGERS IV SOLN: 500.0000 mL | Freq: Once | INTRAVENOUS | Status: DC

## 2016-05-10 MED ORDER — FENTANYL 2.5 MCG/ML BUPIVACAINE 1/10 % EPIDURAL INFUSION (WH - ANES)
14.0000 mL/h | INTRAMUSCULAR | Status: DC | PRN
  Administered 2016-05-10 (×2): 14 mL/h via EPIDURAL
  Filled 2016-05-10 (×2): qty 125

## 2016-05-10 MED ORDER — LIDOCAINE HCL (PF) 1 % IJ SOLN
30.0000 mL | INTRAMUSCULAR | Status: DC | PRN
  Filled 2016-05-10: qty 30

## 2016-05-10 MED ORDER — ONDANSETRON HCL 4 MG/2ML IJ SOLN: 4.0000 mg | Freq: Four times a day (QID) | INTRAMUSCULAR | Status: DC | PRN

## 2016-05-10 MED ORDER — ONDANSETRON HCL 4 MG PO TABS: 4.0000 mg | ORAL_TABLET | ORAL | Status: DC | PRN

## 2016-05-10 MED ORDER — BENZOCAINE-MENTHOL 20-0.5 % EX AERO
1.0000 "application " | INHALATION_SPRAY | CUTANEOUS | Status: DC | PRN
  Administered 2016-05-10: 1 via TOPICAL
  Filled 2016-05-10: qty 56

## 2016-05-10 MED ORDER — SIMETHICONE 80 MG PO CHEW: 80.0000 mg | CHEWABLE_TABLET | ORAL | Status: DC | PRN

## 2016-05-10 MED ORDER — TETANUS-DIPHTH-ACELL PERTUSSIS 5-2.5-18.5 LF-MCG/0.5 IM SUSP: 0.5000 mL | Freq: Once | INTRAMUSCULAR | Status: DC

## 2016-05-10 MED ORDER — OXYCODONE-ACETAMINOPHEN 5-325 MG PO TABS: 2.0000 | ORAL_TABLET | ORAL | Status: DC | PRN

## 2016-05-10 MED ORDER — LACTATED RINGERS IV SOLN
500.0000 mL | Freq: Once | INTRAVENOUS | Status: AC
  Administered 2016-05-10: 500 mL via INTRAVENOUS

## 2016-05-10 MED ORDER — OXYTOCIN 40 UNITS IN LACTATED RINGERS INFUSION - SIMPLE MED
2.5000 [IU]/h | INTRAVENOUS | Status: DC
  Administered 2016-05-10: 39.96 [IU]/h via INTRAVENOUS
  Filled 2016-05-10: qty 1000

## 2016-05-10 MED ORDER — ACETAMINOPHEN 325 MG PO TABS: 650.0000 mg | ORAL_TABLET | ORAL | Status: DC | PRN

## 2016-05-10 MED ORDER — OXYTOCIN BOLUS FROM INFUSION: 500.0000 mL | INTRAVENOUS | Status: DC

## 2016-05-10 MED ORDER — PHENYLEPHRINE 40 MCG/ML (10ML) SYRINGE FOR IV PUSH (FOR BLOOD PRESSURE SUPPORT)
80.0000 ug | PREFILLED_SYRINGE | INTRAVENOUS | Status: DC | PRN
  Filled 2016-05-10: qty 5
  Filled 2016-05-10: qty 10

## 2016-05-10 MED ORDER — ONDANSETRON HCL 4 MG/2ML IJ SOLN: 4.0000 mg | INTRAMUSCULAR | Status: DC | PRN

## 2016-05-10 MED ORDER — OXYCODONE-ACETAMINOPHEN 5-325 MG PO TABS
1.0000 | ORAL_TABLET | ORAL | Status: DC | PRN
Start: 2016-05-10 — End: 2016-05-10

## 2016-05-10 MED ORDER — IBUPROFEN 600 MG PO TABS
600.0000 mg | ORAL_TABLET | Freq: Four times a day (QID) | ORAL | Status: DC
  Administered 2016-05-10 – 2016-05-12 (×7): 600 mg via ORAL
  Filled 2016-05-10 (×7): qty 1

## 2016-05-10 MED ORDER — LACTATED RINGERS IV SOLN: 500.0000 mL | INTRAVENOUS | Status: DC | PRN

## 2016-05-10 NOTE — Anesthesia Procedure Notes (Signed)
Epidural Patient location during procedure: OB  Staffing Anesthesiologist: Sherrian DiversENENNY, Veronica Torres  Preanesthetic Checklist Completed: patient identified, site marked, surgical consent, pre-op evaluation, timeout performed, IV checked, risks and benefits discussed and monitors and equipment checked  Epidural Patient position: sitting Prep: DuraPrep Patient monitoring: heart rate and blood pressure Approach: midline Location: L4-L5 Injection technique: LOR saline  Needle:  Needle type: Tuohy  Needle gauge: 17 G Needle insertion depth: 7 cm Catheter type: closed end flexible Catheter size: 19 Gauge Catheter at skin depth: 13 cm Test dose: negative and Other  Assessment Events: blood not aspirated, injection not painful, no injection resistance, negative IV test and no paresthesia  Additional Notes Reason for block:procedure for pain

## 2016-05-10 NOTE — Lactation Note (Signed)
This note was copied from a baby's chart. Lactation Consultation Note  Patient Name: Veronica Torres AVWUJ'WToday's Date: 05/10/2016 Reason for consult: Initial assessment Baby at 5 hr of life and mom is worried about supply. She said baby has gone to both breast for 20 minutes each every hr since birth and still continues to suck her hands/cry. The center of mom's L nipple folds in on itself. Latching baby or manual expression causes the center to pull out, creating a pink flat nipple surface. Mom reports the center of the nipple is getting sore. Upon entry baby was latched but appeared to be doing non nutritive suckling. Mom stated she has not eaten in 2 days and she is hungry. Showed mom how to take baby off the breast. R nipple had a compression stripe and mom stated is getting sore. Showed FOB how to swaddle and sooth baby so that mom can eat. Demonstrated manual expression, colostrum noted bilaterally, spoon in room. Discussed baby behavior, feeding frequency, baby belly size, risk of formula, pumping, voids, wt loss, breast changes, and nipple care. Given lactation handouts. Aware of OP services and support group.     Maternal Data Has patient been taught Hand Expression?: Yes Does the patient have breastfeeding experience prior to this delivery?: Yes  Feeding Feeding Type: Breast Fed Length of feed: 35 min  LATCH Score/Interventions Latch: Grasps breast easily, tongue down, lips flanged, rhythmical sucking. Intervention(s): Breast massage  Audible Swallowing: A few with stimulation Intervention(s): Hand expression;Skin to skin;Alternate breast massage  Type of Nipple: Everted at rest and after stimulation  Comfort (Breast/Nipple): Filling, red/small blisters or bruises, mild/mod discomfort  Problem noted: Mild/Moderate discomfort Interventions (Mild/moderate discomfort): Hand expression  Hold (Positioning): No assistance needed to correctly position infant at breast.  LATCH Score:  8  Lactation Tools Discussed/Used WIC Program: Yes   Consult Status Consult Status: Follow-up Date: 05/11/16 Follow-up type: In-patient    Rulon Eisenmengerlizabeth E Joani Cosma 05/10/2016, 5:18 PM

## 2016-05-10 NOTE — Discharge Summary (Signed)
error 

## 2016-05-10 NOTE — Discharge Instructions (Addendum)
Vaginal Delivery, Care After Refer to this sheet in the next few weeks. These discharge instructions provide you with information on caring for yourself after delivery. Your caregiver may also give you specific instructions. Your treatment has been planned according to the most current medical practices available, but problems sometimes occur. Call your caregiver if you have any problems or questions after you go home. HOME CARE INSTRUCTIONS 1. Take over-the-counter or prescription medicines only as directed by your caregiver or pharmacist. 2. Do not drink alcohol, especially if you are breastfeeding or taking medicine to relieve pain. 3. Do not smoke tobacco. 4. Continue to use good perineal care. Good perineal care includes: 1. Wiping your perineum from back to front 2. Keeping your perineum clean. 3. You can do sitz baths twice a day, to help keep this area clean 5. Do not use tampons, douche or have sex until your caregiver says it is okay. 6. Shower only and avoid sitting in submerged water, aside from sitz baths 7. Wear a well-fitting bra that provides breast support. 8. Eat healthy foods. 9. Drink enough fluids to keep your urine clear or pale yellow. 10. Eat high-fiber foods such as whole grain cereals and breads, brown rice, beans, and fresh fruits and vegetables every day. These foods may help prevent or relieve constipation. 11. Avoid constipation with high fiber foods or medications, such as miralax or metamucil 12. Follow your caregiver's recommendations regarding resumption of activities such as climbing stairs, driving, lifting, exercising, or traveling. 13. Talk to your caregiver about resuming sexual activities. Resumption of sexual activities is dependent upon your risk of infection, your rate of healing, and your comfort and desire to resume sexual activity. 14. Try to have someone help you with your household activities and your newborn for at least a few days after you leave  the hospital. 15. Rest as much as possible. Try to rest or take a nap when your newborn is sleeping. 16. Increase your activities gradually. 17. Keep all of your scheduled postpartum appointments. It is very important to keep your scheduled follow-up appointments. At these appointments, your caregiver will be checking to make sure that you are healing physically and emotionally. SEEK MEDICAL CARE IF:   You are passing large clots from your vagina. Save any clots to show your caregiver.  You have a foul smelling discharge from your vagina.  You have trouble urinating.  You are urinating frequently.  You have pain when you urinate.  You have a change in your bowel movements.  You have increasing redness, pain, or swelling near your vaginal incision (episiotomy) or vaginal tear.  You have pus draining from your episiotomy or vaginal tear.  Your episiotomy or vaginal tear is separating.  You have painful, hard, or reddened breasts.  You have a severe headache.  You have blurred vision or see spots.  You feel sad or depressed.  You have thoughts of hurting yourself or your newborn.  You have questions about your care, the care of your newborn, or medicines.  You are dizzy or light-headed.  You have a rash.  You have nausea or vomiting.  You were breastfeeding and have not had a menstrual period within 12 weeks after you stopped breastfeeding.  You are not breastfeeding and have not had a menstrual period by the 12th week after delivery.  You have a fever. SEEK IMMEDIATE MEDICAL CARE IF:   You have persistent pain.  You have chest pain.  You have shortness of breath.    You faint.  You have leg pain.  You have stomach pain.  Your vaginal bleeding saturates two or more sanitary pads in 1 hour. MAKE SURE YOU:   Understand these instructions.  Will watch your condition.  Will get help right away if you are not doing well or get worse. Document Released:  11/23/2000 Document Revised: 04/12/2014 Document Reviewed: 07/23/2012 [employer] Patient Information 2015 [employer]. This information is not intended to replace advice given to you by your health care provider. Make sure you discuss any questions you have with your health care provider.  Sitz Bath A sitz bath is a warm water bath taken in the sitting position. The water covers only the hips and butt (buttocks). We recommend using one that fits in the toilet, to help with ease of use and cleanliness. It may be used for either healing or cleaning purposes. Sitz baths are also used to relieve pain, itching, or muscle tightening (spasms). The water may contain medicine. Moist heat will help you heal and relax.  HOME CARE  Take 3 to 4 sitz baths a day. 18. Fill the bathtub half-full with warm water. 19. Sit in the water and open the drain a little. 20. Turn on the warm water to keep the tub half-full. Keep the water running constantly. 21. Soak in the water for 15 to 20 minutes. 22. After the sitz bath, pat the affected area dry. GET HELP RIGHT AWAY IF: You get worse instead of better. Stop the sitz baths if you get worse. MAKE SURE YOU:  Understand these instructions.  Will watch your condition.  Will get help right away if you are not doing well or get worse. Document Released: 01/03/2005 Document Revised: 08/20/2012 Document Reviewed: 03/26/2011 [employer] Patient Information 2015 [employer]. This information is not intended to replace advice given to you by your health care provider. Make sure you discuss any questions you have with your health care provider.    

## 2016-05-10 NOTE — Progress Notes (Signed)
Labor Progress Note Veronica Torres is a 30 y.o. G2P0101 at 9036w4d admitted for active labor S: no complaint. Pain well controlled with epidural  O:  BP 119/50 mmHg  Pulse 76  Temp(Src) 97.6 F (36.4 C) (Oral)  Resp 18  Ht 5\' 4"  (1.626 m)  Wt 198 lb (89.812 kg)  BMI 33.97 kg/m2  LMP 07/31/2015 EFM: 140/mod var/ one vari decels noted  CVE: Dilation: 7 Effacement (%): 90 Cervical Position: Middle Station: -1 Presentation: Vertex Exam by:: [place] CNM   A&P: 30 y.o. [id number] 1036w4d admitted for active labor #Labor: progressing. Continue expectant #Pain: epidural #FWB: CAT-1 #GBS: negative  Almon Herculesaye T Sandia Pfund, MD 4:46 AM

## 2016-05-10 NOTE — MAU Note (Signed)
Patient presents at [redacted] weeks gestation with c/o contractions since 1800 today. Fetus active. Denies bleeding or discharge.

## 2016-05-10 NOTE — Progress Notes (Signed)
Labor Progress Note Veronica Torres is a 30 y.o. G2P0101 at 3122w4d admitted in active labor S: no complaint. Pain controlled with epidural  O:  BP 115/68 mmHg  Pulse 73  Temp(Src) 97.7 F (36.5 C) (Oral)  Resp 18  Ht 5\' 4"  (1.626 m)  Wt 198 lb (89.812 kg)  BMI 33.97 kg/m2  LMP 07/31/2015 EFM: 140/mod var/+acce/no decels  CVE: Dilation: Lip/rim Effacement (%): 100 Cervical Position: Middle Station: +1 Presentation: Vertex Exam by:: Dr. Candelaria Veronica Torres   A&P: 30 y.o. G2P0101 1722w4d admitted in active labor #Labor: progressing. Expectant #Pain: Epidural #FWB: CAT-1 #GBS: neg  Veronica Herculesaye T Chavon Lucarelli, MD 7:55 AM

## 2016-05-10 NOTE — H&P (Signed)
LABOR ADMISSION HISTORY AND PHYSICAL   Veronica Torres is a 30 y.o. female G21P0101 with IUP at [redacted]w[redacted]d by LMP c/w 7w [place] presenting for active labor. She reports regular contraction every 3-4 minutes. Fetal movement present and at baseline. Denies fluid leak or gush, vaginal bleeding,  headaches, blurry vision, RUQ pain or peripheral edema. She plans on breast feeding. She is not decided for [facility]. Wants IV pain meds for now.    Prenatal History/Complications: Clinic  High Risk r/t hx del [redacted]w[redacted]d - transfer from [place] Prenatal Labs  Dating  LMP consistent with 7 wk [place] Blood type: A/Positive/-- (10/13 0000)   Genetic Screen 1 Screen:    AFP:     Quad:     NIPS: Antibody:Negative (10/13 0000)  Anatomic US wnl (girl) Rubella: Immune (10/13 0000)  GTT  Third trimester: 129 RPR:   neg  Flu vaccine  declined (counseled) HBsAg: Negative (10/13 0000)   TDaP vaccine      02/08/2016                                      Rhogam: HIV: Non-reactive (10/13 0000)   Baby Food    Breast                                           GBS: negative (For PCN allergy, check sensitivities)  Contraception  Possible IUD Pap: negative  Circumcision  Female   Pediatrician  Given list 12/28/15   Support Person  Veronica Torres (fiance)    Past Medical History: Past Medical History  Diagnosis Date  . Medical history non-contributory     Past Surgical History: Past Surgical History  Procedure Laterality Date  . Tooth extraction      Obstetrical History: OB History    Gravida Para Term Preterm AB TAB SAB Ectopic Multiple Living   Social History: Social History   Social History  . Marital Status: Single    Spouse Name: N/A  . Number of Children: N/A  . Years of Education: N/A   Social History Main Topics  . Smoking status: Never Smoker   . Smokeless tobacco: Never Used  . Alcohol Use: No  . Drug Use: No  . Sexual Activity: Yes    Birth Control/ Protection: None   Other Topics Concern  . None    Social History Narrative    Family History: History reviewed. No pertinent family history.  Allergies: No Known Allergies  Prescriptions prior to admission  Medication Sig Dispense Refill Last Dose  . Iron-FA-B Cmp-C-Biot-Probiotic (FUSION PLUS) CAPS Take 1 capsule by mouth daily. 30 capsule 6 05/07/2016 at Unknown time  . Prenatal Vit-Fe Fumarate-FA (MULTIVITAMIN-PRENATAL) 27-0.8 MG TABS tablet Take 1 tablet by mouth daily at 12 noon.    05/07/2016 at Unknown time     Review of Systems  Blood pressure 123/71, pulse 85, temperature 98.6 F (37 C), temperature source Oral, resp. rate 20, height  (1.626 m), weight 198 lb (89.812 kg), last menstrual period 07/31/2015. GEN: appears to be in a lot of pain, alert, cooperative and appears stated age RESP: clear to auscultation bilaterally, no increased WOB CVS:: regular rate and rhythm, no murmurs, no sign of DVT  GI: soft, non-tender; bowel sounds normal MSK: WWP, Homans sign is negative,   Pelvic Exam: Cervical exam: Dilation: 4.5 Effacement (%): 70 Station: -3 Exam by:: Veronica BurnsScarlett Murray, RN BSN Presentation: cephalic per RN  Fetal monitoringBaseline: 150 bpm, Variability: Good {> 6 bpm), Accelerations: Reactive and Decelerations: Absent  Prenatal labs: ABO, Rh: A/Positive/-- (10/13 0000) Antibody: Negative (10/13 0000) Rubella: immune RPR: NON REAC (03/01 1410)  HBsAg: Negative (10/13 0000)  HIV: NONREACTIVE (03/01 1410)  GBS:   Negative 1 hr Glucola: negative  Prenatal Transfer Tool  Maternal Diabetes: No Genetic Screening: CF negative. Didn't have 1 Screen, AFP, Quad or NIPS. Hgb electrophoresis normal. Maternal Ultrasounds/Referrals: Normal Fetal Ultrasounds or other Referrals:  None Maternal Substance Abuse:  No Significant Maternal Medications:  None Significant Maternal Lab Results: None  No results found for this or any previous visit (from the past 24 hour(s)).  Patient Active Problem List    Diagnosis Date Noted  . Anemia of mother in pregnancy, antepartum 02/29/2016  . History of preterm delivery, currently pregnant 11/30/2015  . Supervision of high-risk pregnancy 11/30/2015    Assessment: Veronica Torres is a 30 y.o. G2P0101 at 7642w4d here for active labor  #Labor: expectant. Patient with good contraction. #Pain: Fentanyl as needed #FWB: CAT-1 #ID: GBS negative #MOF: Breast #MOC:undecided #Circ:  n/a  Almon Herculesaye T Bijal Siglin 05/10/2016, 12:38 AM  I was present for the exam and agree with above.  BancroftVirginia Smith, CNM 05/10/2016 1:02 AM

## 2016-05-10 NOTE — Progress Notes (Signed)
OB Note  Feels increased nausea and pressure AFVS normal and stable Category I with accels q2-3855m NAD 9/90/+1/mild caput, unable to tell position. cx still all on right side  A/P: pt stable Put patient back on right side. Recheck one hour. If unchanged, place IUPC and titrate UCs GBS neg  Cornelia Copaharlie Tinzley Dalia, Jr MD Attending Center for [facility](Faculty Practice)

## 2016-05-10 NOTE — Anesthesia Preprocedure Evaluation (Addendum)
Anesthesia Evaluation  Patient identified by MRN, date of birth, ID band Patient awake    Reviewed: Allergy & Precautions, NPO status , Patient's Chart, lab work & pertinent test results  Airway Mallampati: II  TM Distance: >3 FB Neck ROM: Full    Dental no notable dental hx.    Pulmonary neg pulmonary ROS,    Pulmonary exam normal breath sounds clear to auscultation       Cardiovascular negative cardio ROS Normal cardiovascular exam Rhythm:Regular Rate:Normal     Neuro/Psych negative neurological ROS  negative psych ROS   GI/Hepatic negative GI ROS, Neg liver ROS,   Endo/Other  negative endocrine ROS  Renal/GU negative Renal ROS  negative genitourinary   Musculoskeletal negative musculoskeletal ROS (+)   Abdominal (+) + obese,   Peds negative pediatric ROS (+)  Hematology  (+) anemia ,   Anesthesia Other Findings   Reproductive/Obstetrics negative OB ROS                             Anesthesia Physical Anesthesia Plan  ASA: II  Anesthesia Plan: General and Epidural   Post-op Pain Management:    Induction: Intravenous  Airway Management Planned: Natural Airway  Additional Equipment:   Intra-op Plan:   Post-operative Plan:   Informed Consent: I have reviewed the patients History and Physical, chart, labs and discussed the procedure including the risks, benefits and alternatives for the proposed anesthesia with the patient or authorized representative who has indicated his/her understanding and acceptance.   Dental advisory given  Plan Discussed with: CRNA  Anesthesia Plan Comments: (Informed consent obtained prior to proceeding including risk of failure, 1% risk of PDPH, risk of minor discomfort and bruising.  Discussed rare but serious complications including epidural abscess, permanent nerve injury, epidural hematoma.  Discussed alternatives to epidural analgesia and  patient desires to proceed.  Timeout performed pre-procedure verifying patient name, procedure, and platelet count.  Patient tolerated procedure well. )        Anesthesia Quick Evaluation

## 2016-05-10 NOTE — Progress Notes (Signed)
Labor Progress Note Damian Leavellmanda Groeneveld is a 30 y.o. G2P0101 at 9876w4d presented for active labor. S: Increased nausea, vomitingx1, increased pressure. Minimal discomfort with contractions.  O:  BP 111/57 mmHg  Pulse 74  Temp(Src) 97.9 F (36.6 C) (Oral)  Resp 18  Ht 5\' 4"  (1.626 m)  Wt 89.812 kg (198 lb)  BMI 33.97 kg/m2  LMP 07/31/2015 General: alert, cooperative and no distress EFM: 135/mod/+accels/no decels  CVE: Dilation: 9 Effacement (%): 100 Cervical Position: Middle (right) Station: +1 Presentation: Vertex Exam by::  (Dr. Vergie LivingPickens)   A&P: 30 y.o. G2P0101 3276w4d here for active labor, in stable condition. #Labor: Progressing well. Encouraged to lay on right side with peanut ball. #Pain: Well controlled with epidural #FWB: Category 1 strip #GBS: neg  Barnie Mortolleen Tamme Mozingo, Med Student 10:07 AM

## 2016-05-10 NOTE — Progress Notes (Addendum)
L&D Note  05/10/2016 - 8:53 AM  30 y.o. G2P0101 2790w4d. Pregnancy complicated by nothing  Veronica Torres is admitted for active, term labor   Subjective:  Comfortable with epidural. Feels not uncomfortable UCs   Objective:    Current Vital Signs 24h Vital Sign Ranges  T 97.9 F (36.6 C) Temp  Avg: 98 F (36.7 C)  Min: 97.6 F (36.4 C)  Max: 98.6 F (37 C)  BP 111/69 mmHg BP  Min: 95/80  Max: 130/73  HR 77 Pulse  Avg: 70.8  Min: 55  Max: 86  RR 18 Resp  Avg: 17.6  Min: 16  Max: 20  SaO2   Not Delivered SpO2  Avg: 100 %  Min: 100 %  Max: 100 %       24 Hour I/O Current Shift I/O  Time Ins Outs       FHR: 130 baseline, no accels or decels, mod var Toco: q2-5848m Gen: NAD SVE: 9/90/0/BBOW-->AROM, mild mec. cx all anterior and right sided.  Labs:  No new labs  Medications Current Facility-Administered Medications  Medication Dose Route Frequency Provider Last Rate Last Dose  . acetaminophen (TYLENOL) tablet 650 mg  650 mg Oral Q4H PRN Almon Herculesaye T Gonfa, MD      . diphenhydrAMINE (BENADRYL) injection 12.5 mg  12.5 mg Intravenous Q15 min PRN Sherrian DiversBruce Denenny, MD      . ePHEDrine injection 10 mg  10 mg Intravenous PRN Sherrian DiversBruce Denenny, MD      . ePHEDrine injection 10 mg  10 mg Intravenous PRN Sherrian DiversBruce Denenny, MD      . fentaNYL (SUBLIMAZE) injection 100 mcg  100 mcg Intravenous Q1H PRN Tilda BurrowJohn Ferguson V, MD   100 mcg at 05/10/16 0144  . fentaNYL 2.5 mcg/ml w/bupivacaine 0.1% in NS 100ml epidural infusion (WH-ANES)  14 mL/hr Epidural Continuous PRN Sherrian DiversBruce Denenny, MD 14 mL/hr at 05/10/16 0223 14 mL/hr at 05/10/16 0223  . lactated ringers infusion 500 mL  500 mL Intravenous Once Sherrian DiversBruce Denenny, MD      . lactated ringers infusion 500-1,000 mL  500-1,000 mL Intravenous PRN Almon Herculesaye T Gonfa, MD      . lactated ringers infusion   Intravenous Continuous Almon Herculesaye T Gonfa, MD 125 mL/hr at 05/10/16 0824    . lidocaine (PF) (XYLOCAINE) 1 % injection 30 mL  30 mL Subcutaneous PRN Almon Herculesaye T Gonfa, MD      . ondansetron  (ZOFRAN) injection 4 mg  4 mg Intravenous Q6H PRN Almon Herculesaye T Gonfa, MD      . oxyCODONE-acetaminophen (PERCOCET/ROXICET) 5-325 MG per tablet 1 tablet  1 tablet Oral Q4H PRN Almon Herculesaye T Gonfa, MD      . oxyCODONE-acetaminophen (PERCOCET/ROXICET) 5-325 MG per tablet 2 tablet  2 tablet Oral Q4H PRN Almon Herculesaye T Gonfa, MD      . oxytocin (PITOCIN) IV BOLUS FROM BAG  500 mL Intravenous Continuous Almon Herculesaye T Gonfa, MD      . oxytocin (PITOCIN) IV infusion 40 units in LR 1000 mL - Premix  2.5 Units/hr Intravenous Continuous Almon Herculesaye T Gonfa, MD      . PHENYLephrine 40 mcg/ml in normal saline Adult IV Push Syringe  80 mcg Intravenous PRN Sherrian DiversBruce Denenny, MD      . PHENYLephrine 40 mcg/ml in normal saline Adult IV Push Syringe  80 mcg Intravenous PRN Sherrian DiversBruce Denenny, MD      . PHENYLephrine 40 mcg/ml in normal saline Adult IV Push Syringe  80 mcg Intravenous PRN Sherrian DiversBruce Denenny, MD      .  sodium citrate-citric acid (ORACIT) solution 30 mL  30 mL Oral Q2H PRN Almon Hercules, MD      . sodium phosphate (FLEET) 7-19 GM/118ML enema 1 enema  1 enema Rectal PRN Almon Hercules, MD       Facility-Administered Medications Ordered in Other Encounters  Medication Dose Route Frequency Provider Last Rate Last Dose  . lidocaine (PF) (XYLOCAINE) 1 % injection    Anesthesia Intra-op Sherrian Divers, MD   6 mL at 05/10/16 0220    Assessment & Plan:  Pt doing well *IUP: routine care *Labor: augment with pitocin PRN. Pelvis feels adequate. Peds @ delivery for mec. Place pt on right side. Anticipate NSVD *GBS: neg *Analgesia: working epidural in place  Cornelia Copa. MD Attending Center for [facility])

## 2016-05-11 MED ORDER — IBUPROFEN 600 MG PO TABS: 600.0000 mg | ORAL_TABLET | Freq: Four times a day (QID) | ORAL | Status: DC | PRN

## 2016-05-11 NOTE — Anesthesia Postprocedure Evaluation (Signed)
Anesthesia Post Note  Patient: Veronica Torres  Procedure(s) Performed: * No procedures listed *  Patient location during evaluation: Mother Baby Anesthesia Type: Epidural Level of consciousness: awake and alert Pain management: pain level controlled Vital Signs Assessment: post-procedure vital signs reviewed and stable Respiratory status: spontaneous breathing, nonlabored ventilation and respiratory function stable Cardiovascular status: stable Postop Assessment: no headache, no backache and epidural receding Anesthetic complications: no     Last Vitals:  Filed Vitals:   05/11/16 0316 05/11/16 0558  BP: 105/56 137/70  Pulse: 73 72  Temp: 36.9 C 36.8 C  Resp: 20 18    Last Pain:  Filed Vitals:   05/11/16 0558  PainSc: 0-No pain   Pain Goal: Patients Stated Pain Goal: 4 (05/10/16 0719)               Junious SilkGILBERT,Manette Doto

## 2016-05-11 NOTE — Lactation Note (Signed)
This note was copied from a baby's chart. Lactation Consultation Note Mom had trouble latching baby d/t baby not interested in BF at this time. Baby hadn't BF and mom was concerned. After bath mom was doing STS and baby latched on BF well. Discussed positioning w/mom and breast massage. Hand expressed colostrum from opposite breast.  Patient Name: Veronica Torres WGNFA'OToday's Date: 05/11/2016 Reason for consult: Follow-up assessment;Difficult latch   Maternal Data    Feeding Feeding Type: Breast Fed Length of feed:  (still BF)  LATCH Score/Interventions Latch: Repeated attempts needed to sustain latch, nipple held in mouth throughout feeding, stimulation needed to elicit sucking reflex. Intervention(s): Skin to skin;Teach feeding cues;Waking techniques Intervention(s): Adjust position;Assist with latch;Breast massage;Breast compression  Audible Swallowing: A few with stimulation Intervention(s): Hand expression Intervention(s): Hand expression  Type of Nipple: Everted at rest and after stimulation  Comfort (Breast/Nipple): Filling, red/small blisters or bruises, mild/mod discomfort  Problem noted: Mild/Moderate discomfort Interventions (Mild/moderate discomfort): Hand massage;Hand expression  Hold (Positioning): Assistance needed to correctly position infant at breast and maintain latch. Intervention(s): Skin to skin;Position options;Support Pillows;Breastfeeding basics reviewed  LATCH Score: 6  Lactation Tools Discussed/Used     Consult Status Consult Status: Follow-up Date: 05/11/16 (in pm) Follow-up type: In-patient    Talya Quain, Diamond NickelLAURA G 05/11/2016, 3:09 AM

## 2016-05-11 NOTE — Progress Notes (Signed)
UR chart review completed.  

## 2016-05-11 NOTE — Progress Notes (Signed)
Post Partum Day 1 Subjective:  Veronica Torres is a 30 y.o. [id number] 4118w4d s/p SVD at 1130 yesterday.  No acute events overnight.  Pt denies problems with ambulating, voiding or po intake.  She denies nausea or vomiting.  Pain is well controlled.  She has had flatus.  Lochia Moderate.  Plan for birth control is no method.  Method of Feeding: Breast - going well.  Objective: Blood pressure 137/70, pulse 72, temperature 98.2 F (36.8 C), temperature source Oral, resp. rate 18, height 5\' 4"  (1.626 m), weight 89.812 kg (198 lb), last menstrual period 07/31/2015, SpO2 99 %, unknown if currently breastfeeding.  Physical Exam:  General: alert, cooperative and no distress Lochia:normal flow Chest: normal WOB Heart: Regular rate/rhythm. No murmurs. Abdomen: +BS, soft, mild TTP (appropriate) Uterine Fundus: firm DVT Evaluation: No evidence of DVT seen on physical exam. Extremities: mild edema   Recent Labs  05/10/16 0035  HGB 11.4*  HCT 34.5*    Assessment/Plan:  ASSESSMENT: Veronica Torres is a 30 y.o. [id number] 6918w4d s/p SVD, doing well.  #Pain: Well controlled with ibuprofen #FEN/GI: Tolerating normal diet well #postpartum care: - Continue routine PP care - Breastfeeding support PRN - Lactation Consult - Contraception: Discussed various method, but pt. Is still unsure. - Anticipate d/c tonight or tomorrow   LOS: 1 day   Barnie Mortolleen McGuire, Med Student  CNM attestation Post Partum Day #1  Veronica Torres is a 30 y.o. [id number] s/p SVD.  Pt denies problems with ambulating, voiding or po intake. Pain is well controlled.  Plan for birth control is undecided.  Method of Feeding: breast  PE:  BP 137/70 mmHg  Pulse 72  Temp(Src) 98.2 F (36.8 C) (Oral)  Resp 18  Ht 5\' 4"  (1.626 m)  Wt 89.812 kg (198 lb)  BMI 33.97 kg/m2  SpO2 99%  LMP 07/31/2015  Breastfeeding? Unknown Fundus firm  Plan for discharge: possibly later today or tomorrow; [facility]  Cam HaiSHAW,  Kalen Neidert, [place] 10:29 AM  05/11/2016

## 2016-05-12 NOTE — Lactation Note (Signed)
This note was copied from a baby's chart. Lactation Consultation Note  Mom reports that BF is improving.  Taught breast compression to keep the baby active and also showed her an off-center latch to improve depth.  Milk was dripping on the left breast while baby was feeding on the right side. Swallows are audible.  Explained to mother that if the baby does not soften the breast she needs to pump to soften. She reports her nipples are sore but healing Informed of support groups and outpatient services, Patient Name: Veronica Torres Veronica Torres's Date: 05/12/2016 Reason for consult: Follow-up assessment   Maternal Data    Feeding Feeding Type: Breast Fed Length of feed: 20 min  LATCH Score/Interventions Latch: Repeated attempts needed to sustain latch, nipple held in mouth throughout feeding, stimulation needed to elicit sucking reflex.  Audible Swallowing: A few with stimulation  Type of Nipple: Everted at rest and after stimulation  Comfort (Breast/Nipple): Soft / non-tender     Hold (Positioning): Assistance needed to correctly position infant at breast and maintain latch.  LATCH Score: 7  Lactation Tools Discussed/Used     Consult Status Consult Status: PRN    Soyla DryerJoseph, Dajanee Voorheis 05/12/2016, 11:37 AM

## 2016-05-12 NOTE — Progress Notes (Signed)
Telephone call to Dr Alvester MorinNewton to report pt states,"my legs feel heavy and pelvis feels different." No pain reported and pt ambulates well. Dr Alvester MorinNewton states okay to discharge pt today.  If problem continues in one week pt is to call the clinic for an appointment.

## 2016-05-12 NOTE — Discharge Summary (Signed)
OB Discharge Summary     Patient Name: Veronica Torres DOB: 07/27/1986 MRN: [id number]  Date of admission: 05/09/2016 DeliveriDamian Leavellng MD: [place] BingPICKENS, CHARLIE   Date of discharge: 05/12/2016  Admitting diagnosis: 40 WEEKS CTX Intrauterine pregnancy: 7319w4d     Secondary diagnosis:  Active Problems:   History of preterm delivery, currently pregnant   Supervision of high-risk pregnancy   Anemia of mother in pregnancy, antepartum   NSVD (normal spontaneous vaginal delivery)   Active labor at term  Additional problems: none     Discharge diagnosis: Term Pregnancy Delivered                                                                                                Post partum procedures:none  Augmentation: AROM  Complications: None  Hospital course:  Onset of Labor With Vaginal Delivery     30 y.o. yo [id number] at 4619w4d was admitted in Active Labor on 05/09/2016. Patient had an uncomplicated labor course as follows:  Membrane Rupture Time/Date: 8:50 AM ,05/10/2016   Intrapartum Procedures: Episiotomy: None [1]                                         Lacerations:  None [1]  Patient had a delivery of a Viable infant. 05/10/2016  Information for the patient's newborn:  Marchia BondOsei, Girl Leila [[id number]][[id number]]  Delivery Method: Vaginal, Spontaneous Delivery (Filed from Delivery Summary)    Pateint had an uncomplicated postpartum course.  She is ambulating, tolerating a regular diet, passing flatus, and urinating well. Patient is discharged home in stable condition on 05/12/2016.    Physical exam  Filed Vitals:   05/11/16 0316 05/11/16 0558 05/11/16 1752 05/12/16 0638  BP: 105/56 137/70 114/57 103/53  Pulse: 73 72 78 70  Temp: 98.5 F (36.9 C) 98.2 F (36.8 C) 98.2 F (36.8 C) 98 F (36.7 C)  TempSrc: Oral Oral Oral Oral  Resp: 20 18 18 18   Height:      Weight:      SpO2: 99%      General: alert and cooperative Lochia: appropriate Uterine Fundus: firm Incision: N/A DVT Evaluation: No evidence  of DVT seen on physical exam. Labs: Lab Results  Component Value Date   WBC 8.8 05/10/2016   HGB 11.4* 05/10/2016   HCT 34.5* 05/10/2016   MCV 79.1 05/10/2016   PLT 205 05/10/2016   CMP Latest Ref Rng 02/20/2016  Glucose 65 - 99 mg/dL 88  BUN 6 - 20 mg/dL <1(H<5(L)  Creatinine 0.860.44 - 1.00 mg/dL 5.780.58  Sodium 469135 - 629145 mmol/L 136  Potassium 3.5 - 5.1 mmol/L 3.8  Chloride 101 - 111 mmol/L 105  CO2 22 - 32 mmol/L 18(L)  Calcium 8.9 - 10.3 mg/dL 8.9  Total Protein 6.5 - 8.1 g/dL 6.9  Total Bilirubin 0.3 - 1.2 mg/dL 0.4  Alkaline Phos 38 - 126 U/L 59  AST 15 - 41 U/L 19  ALT 14 - 54 U/L 13(L)    Discharge instruction: per After Visit  Summary and "Baby and Me Booklet".  After visit meds:    Medication List    TAKE these medications        FUSION PLUS Caps  Take 1 capsule by mouth daily.     ibuprofen 600 MG tablet  Commonly known as:  ADVIL,MOTRIN  Take 1 tablet (600 mg total) by mouth every 6 (six) hours as needed.     multivitamin-prenatal 27-0.8 MG Tabs tablet  Take 1 tablet by mouth daily at 12 noon.        Diet: routine diet  Activity: Advance as tolerated. Pelvic rest for 6 weeks.   Outpatient follow up:6 weeks Follow up Appt:No future appointments. Follow up Visit:No Follow-up on file.  Postpartum contraception: Undecided  Newborn Data: Live born female  Birth Weight: 6 lb 13 oz (3090 g) APGAR: 9, 10  Baby Feeding: Breast Disposition:home with mother   05/12/2016 Cam Hai, CNM  8:51 AM

## 2016-06-14 ENCOUNTER — Ambulatory Visit (INDEPENDENT_AMBULATORY_CARE_PROVIDER_SITE_OTHER): Payer: Medicaid Other | Admitting: Obstetrics and Gynecology

## 2016-06-14 ENCOUNTER — Encounter: Payer: Self-pay | Admitting: Clinical

## 2016-06-14 NOTE — Patient Instructions (Signed)
Contraception Choices Contraception (birth control) is the use of any methods or devices to prevent pregnancy. Below are some methods to help avoid pregnancy. HORMONAL METHODS   Contraceptive implant. This is a thin, plastic tube containing progesterone hormone. It does not contain estrogen hormone. Your health care provider inserts the tube in the inner part of the upper arm. The tube can remain in place for up to 3 years. After 3 years, the implant must be removed. The implant prevents the ovaries from releasing an egg (ovulation), thickens the cervical mucus to prevent sperm from entering the uterus, and thins the lining of the inside of the uterus.  Progesterone-only injections. These injections are given every 3 months by your health care provider to prevent pregnancy. This synthetic progesterone hormone stops the ovaries from releasing eggs. It also thickens cervical mucus and changes the uterine lining. This makes it harder for sperm to survive in the uterus.  Birth control pills. These pills contain estrogen and progesterone hormone. They work by preventing the ovaries from releasing eggs (ovulation). They also cause the cervical mucus to thicken, preventing the sperm from entering the uterus. Birth control pills are prescribed by a health care provider.Birth control pills can also be used to treat heavy periods.  Minipill. This type of birth control pill contains only the progesterone hormone. They are taken every day of each month and must be prescribed by your health care provider.  Birth control patch. The patch contains hormones similar to those in birth control pills. It must be changed once a week and is prescribed by a health care provider.  Vaginal ring. The ring contains hormones similar to those in birth control pills. It is left in the vagina for 3 weeks, removed for 1 week, and then a new one is put back in place. The patient must be comfortable inserting and removing the ring  from the vagina.A health care provider's prescription is necessary.  Emergency contraception. Emergency contraceptives prevent pregnancy after unprotected sexual intercourse. This pill can be taken right after sex or up to 5 days after unprotected sex. It is most effective the sooner you take the pills after having sexual intercourse. Most emergency contraceptive pills are available without a prescription. Check with your pharmacist. Do not use emergency contraception as your only form of birth control. BARRIER METHODS   Female condom. This is a thin sheath (latex or rubber) that is worn over the penis during sexual intercourse. It can be used with spermicide to increase effectiveness.  Female condom. This is a soft, loose-fitting sheath that is put into the vagina before sexual intercourse.  Diaphragm. This is a soft, latex, dome-shaped barrier that must be fitted by a health care provider. It is inserted into the vagina, along with a spermicidal jelly. It is inserted before intercourse. The diaphragm should be left in the vagina for 6 to 8 hours after intercourse.  Cervical cap. This is a round, soft, latex or plastic cup that fits over the cervix and must be fitted by a health care provider. The cap can be left in place for up to 48 hours after intercourse.  Sponge. This is a soft, circular piece of polyurethane foam. The sponge has spermicide in it. It is inserted into the vagina after wetting it and before sexual intercourse.  Spermicides. These are chemicals that kill or block sperm from entering the cervix and uterus. They come in the form of creams, jellies, suppositories, foam, or tablets. They do not require a   prescription. They are inserted into the vagina with an applicator before having sexual intercourse. The process must be repeated every time you have sexual intercourse. INTRAUTERINE CONTRACEPTION  Intrauterine device (IUD). This is a T-shaped device that is put in a woman's uterus  during a menstrual period to prevent pregnancy. There are 2 types:  Copper IUD. This type of IUD is wrapped in copper wire and is placed inside the uterus. Copper makes the uterus and fallopian tubes produce a fluid that kills sperm. It can stay in place for 10 years.  Hormone IUD. This type of IUD contains the hormone progestin (synthetic progesterone). The hormone thickens the cervical mucus and prevents sperm from entering the uterus, and it also thins the uterine lining to prevent implantation of a fertilized egg. The hormone can weaken or kill the sperm that get into the uterus. It can stay in place for 3-5 years, depending on which type of IUD is used. PERMANENT METHODS OF CONTRACEPTION  Female tubal ligation. This is when the woman's fallopian tubes are surgically sealed, tied, or blocked to prevent the egg from traveling to the uterus.  Hysteroscopic sterilization. This involves placing a small coil or insert into each fallopian tube. Your doctor uses a technique called hysteroscopy to do the procedure. The device causes scar tissue to form. This results in permanent blockage of the fallopian tubes, so the sperm cannot fertilize the egg. It takes about 3 months after the procedure for the tubes to become blocked. You must use another form of birth control for these 3 months.  Female sterilization. This is when the female has the tubes that carry sperm tied off (vasectomy).This blocks sperm from entering the vagina during sexual intercourse. After the procedure, the man can still ejaculate fluid (semen). NATURAL PLANNING METHODS  Natural family planning. This is not having sexual intercourse or using a barrier method (condom, diaphragm, cervical cap) on days the woman could become pregnant.  Calendar method. This is keeping track of the length of each menstrual cycle and identifying when you are fertile.  Ovulation method. This is avoiding sexual intercourse during ovulation.  Symptothermal  method. This is avoiding sexual intercourse during ovulation, using a thermometer and ovulation symptoms.  Post-ovulation method. This is timing sexual intercourse after you have ovulated. Regardless of which type or method of contraception you choose, it is important that you use condoms to protect against the transmission of sexually transmitted infections (STIs). Talk with your health care provider about which form of contraception is most appropriate for you.   This information is not intended to replace advice given to you by your health care provider. Make sure you discuss any questions you have with your health care provider.   Document Released: 11/26/2005 Document Revised: 12/01/2013 Document Reviewed: 05/21/2013 Elsevier Interactive Patient Education 2016 Elsevier Inc.  

## 2016-06-14 NOTE — Progress Notes (Signed)
Depression screen PHQ 2/9 06/14/2016  Decreased Interest 0  Down, Depressed, Hopeless 0  PHQ - 2 Score 0  Altered sleeping 0  Tired, decreased energy 0  Change in appetite 0  Feeling bad or failure about yourself  0  Trouble concentrating 0  Moving slowly or fidgety/restless 0  Suicidal thoughts 0  PHQ-9 Score 0    GAD 7 : Generalized Anxiety Score 06/14/2016  Nervous, Anxious, on Edge 0  Control/stop worrying 0  Worry too much - different things 0  Trouble relaxing 0  Restless 0  Easily annoyed or irritable 0  Afraid - awful might happen 0  Total GAD 7 Score 0

## 2016-06-14 NOTE — Progress Notes (Signed)
Subjective:     Veronica Torres is a 30 y.o. female who presents for a postpartum visit. She is 5 weeks postpartum following a spontaneous vaginal delivery. I have fully reviewed the prenatal and intrapartum course. The delivery was at 40.4 gestational weeks. Outcome: spontaneous vaginal delivery. Anesthesia: epidural. Postpartum course has been uneventful. Baby's course has been uncomplicated. Baby is feeding by breast. Bleeding staining only. Bowel function is normal. Bladder function is abnormal: normal . Patient is not sexually active. Contraception method is none. Plans to breastfeed exclusively for several months and declines contraception at this time. Postpartum depression screening: negative.  The following portions of the patient's history were reviewed and updated as appropriate: allergies, current medications, past family history, past medical history, past social history, past surgical history and problem list.  Review of Systems Pertinent items are noted in HPI.   Objective:    BP 114/71 mmHg  Pulse 80  Wt 183 lb 6.4 oz (83.19 kg)  Breastfeeding? Yes  General:  alert, cooperative and no distress   Breasts:  inspection negative, no nipple discharge or bleeding, no masses or nodularity palpable  Lungs: clear to auscultation bilaterally  Heart:  regular rate and rhythm, S1, S2 normal, no murmur, click, rub or gallop  Abdomen: soft, non-tender; bowel sounds normal; no masses,  no organomegaly   Vulva:  normal  Vagina: normal vagina  Cervix:  multiparous appearance  Corpus: normal size, contour, position, consistency, mobility, non-tender  Adnexa:  normal adnexa  Rectal Exam: Not performed.        Assessment:     5 wk postpartum exam. Pap smear not done at today's visit.   Plan:    1. Contraception: abstinence so far. Declines contraception 2. Counseled on LARC options 3. Follow up in: 7 months for Pap or as needed.  Call for appointment if wants contraception

## 2017-05-30 ENCOUNTER — Ambulatory Visit (INDEPENDENT_AMBULATORY_CARE_PROVIDER_SITE_OTHER): Payer: Commercial Managed Care - PPO | Admitting: Family Medicine

## 2017-05-30 ENCOUNTER — Encounter: Payer: Self-pay | Admitting: Family Medicine

## 2017-05-30 VITALS — BP 132/77 | HR 83 | Wt 193.7 lb

## 2017-05-30 DIAGNOSIS — Z3202 Encounter for pregnancy test, result negative: Secondary | ICD-10-CM | POA: Diagnosis not present

## 2017-05-30 DIAGNOSIS — R102 Pelvic and perineal pain: Secondary | ICD-10-CM

## 2017-05-30 DIAGNOSIS — N926 Irregular menstruation, unspecified: Secondary | ICD-10-CM | POA: Diagnosis not present

## 2017-05-30 LAB — POCT URINALYSIS DIP (DEVICE)
BILIRUBIN URINE: NEGATIVE
Glucose, UA: NEGATIVE mg/dL
Leukocytes, UA: NEGATIVE
NITRITE: NEGATIVE
PH: 5.5 (ref 5.0–8.0)
PROTEIN: NEGATIVE mg/dL
Specific Gravity, Urine: >=1.03 (ref 1.005–1.030)
UROBILINOGEN UA: 0.2 mg/dL (ref 0.0–1.0)

## 2017-05-30 LAB — POCT PREGNANCY, URINE: PREG TEST UR: NEGATIVE

## 2017-05-30 NOTE — Progress Notes (Signed)
   CLINIC ENCOUNTER NOTE  History:  31 y.o. [id number] here today for irregular cycle.  Patient delivered 05/10/16, had irregular periods until 4 months ago, then had regular periods afterwards. 04/10/16 was last normal period, was due 05/11/17, but it was 3 weeks and was not normal. Last Friday for 3 days, stopped, then restarted 2 days afterwards as spotting. She is still breastfeeding regularly, breastfeeds "on demand", about 5-6 times per day. She is sexual active, not using birth control only occasionally using condoms. Desires another pregnancy but is not actively trying, "if it happens, it happens". Does not want to be on birth control. Last unprotected intercourse was last month (about 5 weeks ago). Not too much stress in life. Not currently working.  States she notices cramping in the low pelvic area, every so often at nighttime, happens about 1-2 times per week. Lasts about a couple minutes, goes away on it's own. Able to sleep through it.   Has had pap smear in [place] within the last 3 years, per patient was normal.      Past Medical History:  Diagnosis Date  . Medical history non-contributory     Past Surgical History:  Procedure Laterality Date  . TOOTH EXTRACTION      The following portions of the patient's history were reviewed and updated as appropriate: allergies, current medications, past family history, past medical history, past social history, past surgical history and problem list.     Review of Systems:  See above; comprehensive review of systems was otherwise negative.   Objective:  Physical Exam BP 132/77   Pulse 83   Wt 193 lb 11.2 oz (87.9 kg)   LMP 04/18/2017 (Exact Date)   Breastfeeding? Yes   BMI 33.25 kg/m  CONSTITUTIONAL: Well-developed, well-nourished female in no acute distress.  HENT:  Normocephalic, atraumatic SKIN: Skin is warm and dry.  NEUROLGIC: Alert  PSYCHIATRIC: Normal mood and affect.  CARDIOVASCULAR: Normal heart rate noted RESPIRATORY:  Effort and breath sounds normal, no problems with respiration noted ABDOMEN: Soft, no distention noted.  No tenderness, rebound or guarding.  PELVIC:  Normal appearing external genitalia; normal appearing vaginal mucosa and cervix.  Normal appearing discharge, with old scant brown blood on vaginal walls/cervix.  Normal uterine size, no other palpable masses, no uterine or adnexal tenderness. Mild bladder tenderness in the suprapubic region and on bimanual.   Labs and Imaging No results found.  Assessment & Plan:   1. Irregular menstrual cycle - Currently breastfeeding, likely cause of irregular menses - Return if after breastfeeding does not regulate - Also check home pregnancy tests as she is sexually active without using condoms with husband - Pregnancy, urine POC : NEGATIVE (last unprotected intercourse was 5 weeks ago)  2. Pelvic pain in female - Urine Culture  Routine preventative health maintenance measures emphasized.   Jen MowElizabeth Chassidy Layson, DO OB/GYN Fellow [facility]

## 2017-05-30 NOTE — Patient Instructions (Signed)
Dysfunctional Uterine Bleeding °Dysfunctional uterine bleeding is abnormal bleeding from the uterus. Dysfunctional uterine bleeding includes: °· A period that comes earlier or later than usual. °· A period that is lighter, heavier, or has blood clots. °· Bleeding between periods. °· Skipping one or more periods. °· Bleeding after sexual intercourse. °· Bleeding after menopause. ° °Follow these instructions at home: °Pay attention to any changes in your symptoms. Follow these instructions to help with your condition: °Eating and drinking °· Eat well-balanced meals. Include foods that are high in iron, such as liver, meat, shellfish, green leafy vegetables, and eggs. °· If you become constipated: °? Drink plenty of water. °? Eat fruits and vegetables that are high in water and fiber, such as spinach, carrots, raspberries, apples, and mango. °Medicines °· Take over-the-counter and prescription medicines only as told by your health care provider. °· Do not change medicines without talking with your health care provider. °· Aspirin or medicines that contain aspirin may make the bleeding worse. Do not take those medicines: °? During the week before your period. °? During your period. °· If you were prescribed iron pills, take them as told by your health care provider. Iron pills help to replace iron that your body loses because of this condition. °Activity °· If you need to change your sanitary pad or tampon more than one time every 2 hours: °? Lie in bed with your feet raised (elevated). °? Place a cold pack on your lower abdomen. °? Rest as much as possible until the bleeding stops or slows down. °· Do not try to lose weight until the bleeding has stopped and your blood iron level is back to normal. °Other Instructions °· For two months, write down: °? When your period starts. °? When your period ends. °? When any abnormal bleeding occurs. °? What problems you notice. °· Keep all follow up visits as told by your health  care provider. This is important. °Contact a health care provider if: °· You get light-headed or weak. °· You have nausea and vomiting. °· You cannot eat or drink without vomiting. °· You feel dizzy or have diarrhea while you are taking medicines. °· You are taking birth control pills or hormones, and you want to change them or stop taking them. °Get help right away if: °· You develop a fever or chills. °· You need to change your sanitary pad or tampon more than one time per hour. °· Your bleeding becomes heavier, or your flow contains clots more often. °· You develop pain in your abdomen. °· You lose consciousness. °· You develop a rash. °This information is not intended to replace advice given to you by your health care provider. Make sure you discuss any questions you have with your health care provider. °Document Released: 11/23/2000 Document Revised: 05/03/2016 Document Reviewed: 02/21/2015 °Elsevier Interactive Patient Education © 2018 Elsevier Inc. ° °

## 2017-06-01 LAB — URINE CULTURE

## 2018-03-03 ENCOUNTER — Encounter: Payer: Self-pay | Admitting: General Practice

## 2018-03-03 ENCOUNTER — Ambulatory Visit (INDEPENDENT_AMBULATORY_CARE_PROVIDER_SITE_OTHER): Payer: Commercial Managed Care - PPO

## 2018-03-03 DIAGNOSIS — Z3201 Encounter for pregnancy test, result positive: Secondary | ICD-10-CM | POA: Diagnosis not present

## 2018-03-03 LAB — POCT PREGNANCY, URINE: PREG TEST UR: POSITIVE — AB

## 2018-03-03 NOTE — Progress Notes (Signed)
Pt here today for pregnancy test.  Resulted +.  Pt denies any sx.  Pt reports LMP 01/16/18  EDD 10/23/18.  Medications verified.  Pt encouraged to start taking PNV.  Pt given list of medication safe to take in pregnancy.  Proof of pregnancy letter provided for pt to start prenatal care.  Pt did not have any further questions.

## 2018-03-03 NOTE — Progress Notes (Signed)
I have reviewed the nurse's note and agree with the plan of care.  Thressa ShellerHeather Hogan 1:03 PM 03/03/18

## 2018-03-26 ENCOUNTER — Inpatient Hospital Stay (HOSPITAL_COMMUNITY)
Admission: AD | Admit: 2018-03-26 | Discharge: 2018-03-26 | Disposition: A | Discharge status: Home / Self Care | Payer: Commercial Managed Care - PPO | Source: Ambulatory Visit | Attending: Family Medicine | Admitting: Family Medicine

## 2018-03-26 ENCOUNTER — Other Ambulatory Visit: Payer: Self-pay

## 2018-03-26 ENCOUNTER — Encounter (HOSPITAL_COMMUNITY): Payer: Self-pay | Admitting: *Deleted

## 2018-03-26 ENCOUNTER — Telehealth: Payer: Self-pay | Admitting: General Practice

## 2018-03-26 ENCOUNTER — Inpatient Hospital Stay (HOSPITAL_COMMUNITY): Payer: Commercial Managed Care - PPO

## 2018-03-26 DIAGNOSIS — Z3A01 Less than 8 weeks gestation of pregnancy: Secondary | ICD-10-CM | POA: Diagnosis not present

## 2018-03-26 DIAGNOSIS — O26891 Other specified pregnancy related conditions, first trimester: Secondary | ICD-10-CM

## 2018-03-26 DIAGNOSIS — Z3A09 9 weeks gestation of pregnancy: Secondary | ICD-10-CM | POA: Insufficient documentation

## 2018-03-26 DIAGNOSIS — R109 Unspecified abdominal pain: Secondary | ICD-10-CM

## 2018-03-26 DIAGNOSIS — O3680X0 Pregnancy with inconclusive fetal viability, not applicable or unspecified: Secondary | ICD-10-CM

## 2018-03-26 DIAGNOSIS — O209 Hemorrhage in early pregnancy, unspecified: Secondary | ICD-10-CM | POA: Diagnosis not present

## 2018-03-26 DIAGNOSIS — O208 Other hemorrhage in early pregnancy: Secondary | ICD-10-CM | POA: Diagnosis not present

## 2018-03-26 LAB — URINALYSIS, ROUTINE W REFLEX MICROSCOPIC
BACTERIA UA: NONE SEEN
BILIRUBIN URINE: NEGATIVE
Glucose, UA: NEGATIVE mg/dL
KETONES UR: NEGATIVE mg/dL
Leukocytes, UA: NEGATIVE
NITRITE: NEGATIVE
Protein, ur: NEGATIVE mg/dL
SPECIFIC GRAVITY, URINE: 1.024 (ref 1.005–1.030)
pH: 6 (ref 5.0–8.0)

## 2018-03-26 LAB — WET PREP, GENITAL
Clue Cells Wet Prep HPF POC: NONE SEEN
Sperm: NONE SEEN
Trich, Wet Prep: NONE SEEN
Yeast Wet Prep HPF POC: NONE SEEN

## 2018-03-26 LAB — CBC
HEMATOCRIT: 36.8 % (ref 36.0–46.0)
HEMOGLOBIN: 12.1 g/dL (ref 12.0–15.0)
MCH: 27.3 pg (ref 26.0–34.0)
MCHC: 32.9 g/dL (ref 30.0–36.0)
MCV: 82.9 fL (ref 78.0–100.0)
Platelets: 271 10*3/uL (ref 150–400)
RBC: 4.44 MIL/uL (ref 3.87–5.11)
RDW: 13.3 % (ref 11.5–15.5)
WBC: 5.6 10*3/uL (ref 4.0–10.5)

## 2018-03-26 LAB — POCT PREGNANCY, URINE: PREG TEST UR: POSITIVE — AB

## 2018-03-26 LAB — HCG, QUANTITATIVE, PREGNANCY: HCG, BETA CHAIN, QUANT, S: 5763 m[IU]/mL — AB (ref <5)

## 2018-03-26 NOTE — MAU Note (Signed)
Was in [place] last week, very busy.  Came home feeling tired.  Yesterday noted bright red spotting once, then turned pink, now brown.  Having very mild cramps.  Not been seen yet, first appt is 5/6

## 2018-03-26 NOTE — Discharge Instructions (Signed)
Your appointment downstairs in the clinic is at 11 am on Friday. Return to MAU with severe vaginal bleeding or severe abdominal pain.  Return with worsening pain or symptoms of severe vaginal bleeding as an ectopic pregnancy cannot be ruled out at this time and this could be a life threatening condition. Pelvic rest - no tampons, no douching, no sex, nothing in the vagina. No heavy lifting or strenuous exercise.

## 2018-03-26 NOTE — Telephone Encounter (Signed)
Opened in error

## 2018-03-26 NOTE — MAU Provider Note (Signed)
History     CSN: [id number]  Arrival date and time: 03/26/18 1006   First Provider Initiated Contact with Patient 03/26/18 1052      Chief Complaint  Patient presents with  . Abdominal Pain  . Vaginal Bleeding   HPI Veronica Torres 32 y.o. [id number]  Comes to MAU with vaginal bleeding yesterday and today.  Had red to pink bleeding yesterday and now brown blood this morning.  Is worried about the vaginal bleeding and came to be checked.  Has not yet started prenatal care but [facility]   OB History    Gravida  3   Para  2   Term  1   Preterm  1   AB      Living  2     SAB      TAB      Ectopic      Multiple  0   Live Births  2           Past Medical History:  Diagnosis Date  . Medical history non-contributory     Past Surgical History:  Procedure Laterality Date  . TOOTH EXTRACTION      No family history on file.  Social History   Tobacco Use  . Smoking status: Never Smoker  . Smokeless tobacco: Never Used  Substance Use Topics  . Alcohol use: No  . Drug use: No    Allergies: No Known Allergies  Medications Prior to Admission  Medication Sig Dispense Refill Last Dose  . Prenatal Vit-Fe Fumarate-FA (MULTIVITAMIN-PRENATAL) 27-0.8 MG TABS tablet Take 1 tablet by mouth daily at 12 noon.    03/25/2018 at Unknown time    Review of Systems  Constitutional: Negative for fever.  Gastrointestinal: Positive for abdominal pain. Negative for nausea and vomiting.       Occasional abdominal cramping  Genitourinary: Positive for vaginal bleeding. Negative for dysuria and vaginal discharge.   Physical Exam   Blood pressure 118/72, pulse 82, temperature 98.8 F (37.1 C), temperature source Oral, resp. rate 16, weight 220 lb 4 oz (99.9 kg), last menstrual period 01/16/2018, SpO2 98 %, currently breastfeeding.  Physical Exam  Nursing note and vitals reviewed. Constitutional: She is oriented to person, place, and time.  She appears well-developed and well-nourished.  HENT:  Head: Normocephalic.  Eyes: EOM are normal.  Neck: Neck supple.  GI: Soft. There is no tenderness. There is no rebound and no guarding.  Genitourinary:  Genitourinary Comments: Speculum exam: Vagina - Mod amount of dark discharge, no odor Cervix - No active bleeding Bimanual exam: Cervix closed Uterus unable to size due to habitus and retroverted Adnexa non tender, exam limited due to habitus GC/Chlam, wet prep done Chaperone present for exam.   Musculoskeletal: Normal range of motion.  Neurological: She is alert and oriented to person, place, and time.  Skin: Skin is warm and dry.  Psychiatric: She has a normal mood and affect.    MAU Course  Procedures CLINICAL DATA:  Vaginal bleeding in first trimester of pregnancy, spotting since yesterday, mild cramping  EXAM: OBSTETRIC <14 WK US AND TRANSVAGINAL OB US  TECHNIQUE: Both transabdominal and transvaginal ultrasound examinations were performed for complete evaluation of the gestation as well as the maternal uterus, adnexal regions, and pelvic cul-de-sac. Transvaginal technique was performed to assess early pregnancy.  COMPARISON:  None for this gestation  FINDINGS: Intrauterine gestational sac: Present  Yolk sac:  Not visualized  Embryo:  Not visualized  Cardiac Activity: N/A  Heart Rate: N/A  bpm  MSD: 14 mm   6 w   1 d  Subchorionic hemorrhage:  Small subchronic hemorrhage  Maternal uterus/adnexae:  RIGHT ovary normal size and morphology, 1.3 x 2.1 x 1.2 cm.  LEFT ovary normal size and morphology, 1.3 x 1.5 x 0.9 cm.  No free pelvic fluid or adnexal masses.  IMPRESSION: Intrauterine gestational sac identified at 6 weeks 1 day EGA by mean sac diameter.  No yolk sac or fetal pole are identified.  Small subchronic hemorrhage.  Results for orders placed or performed during the hospital encounter of 03/26/18 (from the past 24  hour(s))  Urinalysis, Routine w reflex microscopic     Status: Abnormal   Collection Time: 03/26/18 10:15 AM  Result Value Ref Range   Color, Urine YELLOW YELLOW   APPearance CLEAR CLEAR   Specific Gravity, Urine 1.024 1.005 - 1.030   pH 6.0 5.0 - 8.0   Glucose, UA NEGATIVE NEGATIVE mg/dL   Hgb urine dipstick SMALL (A) NEGATIVE   Bilirubin Urine NEGATIVE NEGATIVE   Ketones, ur NEGATIVE NEGATIVE mg/dL   Protein, ur NEGATIVE NEGATIVE mg/dL   Nitrite NEGATIVE NEGATIVE   Leukocytes, UA NEGATIVE NEGATIVE   RBC / HPF 0-5 0 - 5 RBC/hpf   WBC, UA 0-5 0 - 5 WBC/hpf   Bacteria, UA NONE SEEN NONE SEEN   Squamous Epithelial / LPF 0-5 (A) NONE SEEN   Mucus PRESENT   Pregnancy, urine POC     Status: Abnormal   Collection Time: 03/26/18 10:26 AM  Result Value Ref Range   Preg Test, Ur POSITIVE (A) NEGATIVE  Wet prep, genital     Status: Abnormal   Collection Time: 03/26/18 11:25 AM  Result Value Ref Range   Yeast Wet Prep HPF POC NONE SEEN NONE SEEN   Trich, Wet Prep NONE SEEN NONE SEEN   Clue Cells Wet Prep HPF POC NONE SEEN NONE SEEN   WBC, Wet Prep HPF POC FEW (A) NONE SEEN   Sperm NONE SEEN   CBC     Status: None   Collection Time: 03/26/18 11:28 AM  Result Value Ref Range   WBC 5.6 4.0 - 10.5 K/uL   RBC 4.44 3.87 - 5.11 MIL/uL   Hemoglobin 12.1 12.0 - 15.0 g/dL   HCT 40.9 81.1 - 91.4 %   MCV 82.9 78.0 - 100.0 fL   MCH 27.3 26.0 - 34.0 pg   MCHC 32.9 30.0 - 36.0 g/dL   RDW 78.2 95.6 - 21.3 %   Platelets 271 150 - 400 K/uL  hCG, quantitative, pregnancy     Status: Abnormal   Collection Time: 03/26/18 11:28 AM  Result Value Ref Range   hCG, Beta Chain, Quant, S 5,763 (H) <5 mIU/mL     MDM Blood type A positive Return to MAU with severe vaginal bleeding or severe abdominal pain.  Patient informed that at this time the pregnancy cannot be confirmed to be in the uterus as no yolk sac is visualized on ultrasound.  Advised to return with worsening symptoms as an ectopic pregnancy  cannot be ruled out at this time and this could be a life threatening condition. Pelvic rest - no tampons, no douching, no sex, nothing in the vagina. No heavy lifting or strenuous exercise. Also discussed 3 week discrepancy between certain LMP and sac size on ultrasound today.  Did not specify exactly to client but this is highly suspicious  for threatened miscarriage.   Assessment and Plan  Pregnancy of unknown anatomic location Vaginal bleeding in first trimester  Plan Appointment downstairs in the clinic is at 11 am on Friday - this was changed to MAU as the clinic is closed on Friday for a holiday.  The clinic will call the client to let her know. Return to MAU with severe vaginal bleeding or severe abdominal pain.  Return with worsening pain or symptoms of severe vaginal bleeding as an ectopic pregnancy cannot be ruled out at this time and this could be a life threatening condition. Pelvic rest - no tampons, no douching, no sex, nothing in the vagina. No heavy lifting or strenuous exercise.   Toshiyuki Fredell L Preston Garabedian 03/26/2018, 11:02 AM

## 2018-03-26 NOTE — Telephone Encounter (Addendum)
Patient called and left message on nurse voicemail line stating she is [redacted] weeks pregnant and doesn't have an appt until 5/6. Patient states yesterday she started having pink spotting and today it is brown. Patient isn't sure what she should do. Per chart review, patient went to MAU and is to follow up on 4/19 for bhcg.   Called patient and informed her to follow up at MAU on Friday because the office will be closed. Patient verbalized understanding.

## 2018-03-27 LAB — GC/CHLAMYDIA PROBE AMP (~~LOC~~) NOT AT ARMC
Chlamydia: NEGATIVE
NEISSERIA GONORRHEA: NEGATIVE

## 2018-03-27 LAB — HIV ANTIBODY (ROUTINE TESTING W REFLEX): HIV Screen 4th Generation wRfx: NONREACTIVE

## 2018-03-27 LAB — RPR: RPR Ser Ql: NONREACTIVE

## 2018-03-28 ENCOUNTER — Inpatient Hospital Stay (HOSPITAL_COMMUNITY)
Admission: AD | Admit: 2018-03-28 | Discharge: 2018-03-28 | Disposition: A | Discharge status: Home / Self Care | Payer: Commercial Managed Care - PPO | Source: Ambulatory Visit | Attending: Family Medicine | Admitting: Family Medicine

## 2018-03-28 DIAGNOSIS — Z3A10 10 weeks gestation of pregnancy: Secondary | ICD-10-CM | POA: Insufficient documentation

## 2018-03-28 DIAGNOSIS — R1030 Lower abdominal pain, unspecified: Secondary | ICD-10-CM | POA: Insufficient documentation

## 2018-03-28 DIAGNOSIS — O039 Complete or unspecified spontaneous abortion without complication: Secondary | ICD-10-CM | POA: Diagnosis not present

## 2018-03-28 LAB — HCG, QUANTITATIVE, PREGNANCY: HCG, BETA CHAIN, QUANT, S: 1124 m[IU]/mL — AB (ref <5)

## 2018-03-28 NOTE — Discharge Instructions (Signed)

## 2018-03-28 NOTE — MAU Note (Signed)
Urine in lab 

## 2018-03-28 NOTE — MAU Note (Signed)
Pt here for repeat hcg. Pt having pain 8/10 and bleeding. Labs drawn in lobby.

## 2018-03-28 NOTE — MAU Provider Note (Signed)
  History     CSN: [id number]  Arrival date and time: 03/28/18 1044   First Provider Initiated Contact with Patient 03/28/18 1252      Chief Complaint  Patient presents with  . Abdominal Pain  . Vaginal Bleeding   HPI Veronica Torres is a 32 y.o. G3P1102 at 3744w1d who presents for repeat HCG. She reports lower abdominal cramping and spotting that is the same as it was on 4/17. She states she had an episode of heavy bleeding with clots at home and thinks she passed the pregnancy already.   OB History    Gravida  3   Para  2   Term  1   Preterm  1   AB      Living  2     SAB      TAB      Ectopic      Multiple  0   Live Births  2           Past Medical History:  Diagnosis Date  . Medical history non-contributory     Past Surgical History:  Procedure Laterality Date  . TOOTH EXTRACTION      No family history on file.  Social History   Tobacco Use  . Smoking status: Never Smoker  . Smokeless tobacco: Never Used  Substance Use Topics  . Alcohol use: No  . Drug use: No    Allergies: No Known Allergies  Medications Prior to Admission  Medication Sig Dispense Refill Last Dose  . Prenatal Vit-Fe Fumarate-FA (MULTIVITAMIN-PRENATAL) 27-0.8 MG TABS tablet Take 1 tablet by mouth daily at 12 noon.    03/25/2018 at Unknown time    Review of Systems Physical Exam   Blood pressure 123/73, pulse 63, temperature 98.6 F (37 C), temperature source Oral, resp. rate 16, weight 217 lb (98.4 kg), last menstrual period 01/16/2018, SpO2 100 %, currently breastfeeding.  Physical Exam  MAU Course  Procedures Results for Veronica Torres, Veronica Torres (MRN [id number]) as of 03/28/2018 12:57  Ref. Range 03/26/2018 10:26 03/26/2018 11:25 03/26/2018 11:28 03/26/2018 12:04 03/28/2018 10:52  HCG, Beta Chain, Quant, S Latest Ref Range: <5 mIU/mL   5,763 (H)  1,124 (H)   MDM HCG Significant drop in HCG. Discussed with patient that this signifies a miscarriage. Return precautions for pain and  bleeding reviewed.   Assessment and Plan   1. SAB (spontaneous abortion)    -Discharge home in stable condition -Patient [facility] week for repeat blood work and 2 weeks with a provider, message sent.  -Patient may return to MAU as needed or if her condition were to change or worsen  Rolm BookbinderCaroline M Neill CNM 03/28/2018, 12:52 PM

## 2018-04-01 ENCOUNTER — Telehealth: Payer: Self-pay | Admitting: General Practice

## 2018-04-01 NOTE — Telephone Encounter (Signed)
Called and notified patient of appointments scheduled for 04/04/18 (Labs) and 04/18/18 (SAB f/u).  Patient voiced understanding.

## 2018-04-04 ENCOUNTER — Other Ambulatory Visit: Payer: Self-pay | Admitting: *Deleted

## 2018-04-04 ENCOUNTER — Other Ambulatory Visit: Payer: Commercial Managed Care - PPO

## 2018-04-04 DIAGNOSIS — O039 Complete or unspecified spontaneous abortion without complication: Secondary | ICD-10-CM | POA: Diagnosis not present

## 2018-04-05 LAB — BETA HCG QUANT (REF LAB): HCG QUANT: 39 m[IU]/mL

## 2018-04-07 ENCOUNTER — Encounter (INDEPENDENT_AMBULATORY_CARE_PROVIDER_SITE_OTHER): Payer: Self-pay

## 2018-04-14 ENCOUNTER — Encounter: Payer: Commercial Managed Care - PPO | Admitting: Advanced Practice Midwife

## 2018-04-18 ENCOUNTER — Ambulatory Visit: Payer: Commercial Managed Care - PPO | Admitting: Certified Nurse Midwife

## 2018-07-28 ENCOUNTER — Telehealth: Payer: Self-pay | Admitting: *Deleted

## 2018-07-28 NOTE — Telephone Encounter (Addendum)
Pt left message on 8/16 stating she is still having later periods and also dizziness. She had SAB in April. She wants to know if she needs appt in office or go to ED. Please advise.   8/21  [phone]  MyChart message sent to pt by Dorisann Framesamika Martin, CMA.  She was advised to schedule office appt.  Pt has scheduled appt on 9/6 @ [facility].

## 2018-08-15 ENCOUNTER — Ambulatory Visit (INDEPENDENT_AMBULATORY_CARE_PROVIDER_SITE_OTHER): Payer: Commercial Managed Care - PPO | Admitting: Obstetrics and Gynecology

## 2018-08-15 ENCOUNTER — Encounter: Payer: Self-pay | Admitting: Obstetrics and Gynecology

## 2018-08-15 VITALS — BP 130/68 | HR 84 | Wt 215.0 lb

## 2018-08-15 DIAGNOSIS — Z3201 Encounter for pregnancy test, result positive: Secondary | ICD-10-CM

## 2018-08-15 DIAGNOSIS — Z8759 Personal history of other complications of pregnancy, childbirth and the puerperium: Secondary | ICD-10-CM | POA: Insufficient documentation

## 2018-08-15 DIAGNOSIS — Z3200 Encounter for pregnancy test, result unknown: Secondary | ICD-10-CM | POA: Insufficient documentation

## 2018-08-15 LAB — POCT PREGNANCY, URINE: PREG TEST UR: POSITIVE — AB

## 2018-08-15 NOTE — Progress Notes (Signed)
Subjective:   Veronica Torres is a 32 y.o. G49P1102 female non-pregnant here with concerns about pregnancy. She had a miscarriage back in April and did not follow up. LMP August 14.   Denies abnormal vaginal bleeding, discharge, pelvic pain, problems with intercourse or other gynecologic concerns.  Negative pregnancy test at home.    Gynecologic History Recent miscarriage  Contraception: none Last Pap: 2017. Results were: normal with negative HPV Last mammogram: NA.  Obstetric History OB History  Gravida Para Term Preterm AB Living  3 2 1 1   2   SAB TAB Ectopic Multiple Live Births        0 2    # Outcome Date GA Lbr Len/2nd Weight Sex Delivery Anes PTL Lv  3 Gravida           2 Term 05/10/16 [redacted]w[redacted]d 20:20 / 00:45 6 lb 13 oz (3.09 kg) F Vag-Spont EPI  LIV  1 Preterm 04/23/07 [redacted]w[redacted]d  5 lb 15 oz (2.693 kg) M Vag-Spont EPI N LIV    Past Medical History:  Diagnosis Date  . Medical history non-contributory     Past Surgical History:  Procedure Laterality Date  . TOOTH EXTRACTION      Current Outpatient Medications on File Prior to Visit  Medication Sig Dispense Refill  . Prenatal Vit-Fe Fumarate-FA (MULTIVITAMIN-PRENATAL) 27-0.8 MG TABS tablet Take 1 tablet by mouth daily at 12 noon.      No current facility-administered medications on file prior to visit.     No Known Allergies  Social History:  reports that she has never smoked. She has never used smokeless tobacco. She reports that she does not drink alcohol or use drugs.  No family history on file.  The following portions of the patient's history were reviewed and updated as appropriate: allergies, current medications, past family history, past medical history, past social history, past surgical history and problem list.  Review of Systems Pertinent items noted in HPI and remainder of comprehensive ROS otherwise negative.   Objective:  BP 130/68   Pulse 84   Wt 215 lb (97.5 kg)   LMP 01/16/2018 (Exact Date)    Breastfeeding? Unknown   BMI 36.90 kg/m  CONSTITUTIONAL: Well-developed, well-nourished female in no acute distress.  HENT:  Normocephalic, atraumatic, External right and left ear normal. Oropharynx is clear and moist EYES: Conjunctivae and EOM are normal. Pupils are equal, round, and reactive to light. No scleral icterus.  NECK: Normal range of motion, supple, no masses.  Normal thyroid.  SKIN: Skin is warm and dry. No rash noted. Not diaphoretic. No erythema. No pallor. MUSCULOSKELETAL: Normal range of motion. No tenderness.  No cyanosis, clubbing, or edema.  2+ distal pulses. NEUROLOGIC: Alert and oriented to person, place, and time. Normal reflexes, muscle tone coordination. No cranial nerve deficit noted. PSYCHIATRIC: Normal mood and affect. Normal behavior. Normal judgment and thought content. CARDIOVASCULAR: Normal heart rate noted, regular rhythm RESPIRATORY: Clear to auscultation bilaterally. Effort and breath sounds normal, no problems with respiration noted. ABDOMEN: Soft, normal bowel sounds, no distention noted.  No tenderness, rebound or guarding.   Assessment and Plan:   A:  1. History of miscarriage - Did not follow up  - POCT urine pregnancy - Beta hCG quant (ref lab)  2. Encounter for pregnancy test, result unknown - urine pregnancy test positive -Quant pending. Will call patient with results.   Routine preventative health maintenance measures emphasized. Please refer to After Visit Summary for other counseling recommendations.  Chenika Nevils, Harolyn Rutherford, NP  Gynecologist, [employer] for [employer], [facility]

## 2018-08-15 NOTE — Progress Notes (Signed)
miscarriage in April states her body is feeling weird, states she's having pregnancy symptoms.

## 2018-08-16 LAB — BETA HCG QUANT (REF LAB): hCG Quant: 20 m[IU]/mL

## 2018-08-18 ENCOUNTER — Telehealth: Payer: Self-pay | Admitting: Obstetrics and Gynecology

## 2018-08-18 NOTE — Telephone Encounter (Signed)
Called patient this morning to discuss recent Hcg level. Patient to come tomorrow AM @0900  to the [facility] for another stat hcg level. This will help [place] see if this is a new pregnancy or from recent pregnancy 4 months ago.  Patient voices understanding.   Veronica Torres I, NP 08/18/2018 9:10 AM

## 2018-08-19 ENCOUNTER — Ambulatory Visit (INDEPENDENT_AMBULATORY_CARE_PROVIDER_SITE_OTHER): Payer: Commercial Managed Care - PPO | Admitting: General Practice

## 2018-08-19 DIAGNOSIS — E349 Endocrine disorder, unspecified: Secondary | ICD-10-CM

## 2018-08-19 LAB — HCG, QUANTITATIVE, PREGNANCY: hCG, Beta Chain, Quant, S: 135 m[IU]/mL — ABNORMAL HIGH (ref <5)

## 2018-08-19 NOTE — Progress Notes (Signed)
Patient presents to office today for stat bhcg. Patient denies pain or bleeding. Discussed with patient we are monitoring your bhcg levels today & asked she wait in lobby for results/updated plan of care. Patient verbalized understanding to all & had no questions at this time.  Reviewed results with Venia Carbon who finds appropriate rise in bhcg levels indicating new pregnancy- patient should have 1st trimester ultrasound in 1 month. Scheduled 10/10 @ 9am.  Informed patient of results & ultrasound appt. Patient verbalized understanding & had no questions.

## 2018-08-20 NOTE — Progress Notes (Signed)
I agree with the nurses note and plan of care.   Venia Carbon I, NP 08/20/2018 8:24 AM

## 2018-09-16 ENCOUNTER — Ambulatory Visit: Payer: Commercial Managed Care - PPO | Admitting: Obstetrics and Gynecology

## 2018-09-18 ENCOUNTER — Ambulatory Visit (INDEPENDENT_AMBULATORY_CARE_PROVIDER_SITE_OTHER): Payer: Commercial Managed Care - PPO | Admitting: General Practice

## 2018-09-18 ENCOUNTER — Ambulatory Visit (HOSPITAL_COMMUNITY)
Admission: RE | Admit: 2018-09-18 | Discharge: 2018-09-18 | Disposition: A | Discharge status: Home / Self Care | Payer: Commercial Managed Care - PPO | Source: Ambulatory Visit | Attending: Obstetrics and Gynecology | Admitting: Obstetrics and Gynecology

## 2018-09-18 ENCOUNTER — Encounter: Payer: Self-pay | Admitting: Family Medicine

## 2018-09-18 DIAGNOSIS — Z3687 Encounter for antenatal screening for uncertain dates: Secondary | ICD-10-CM | POA: Diagnosis not present

## 2018-09-18 DIAGNOSIS — Z3A08 8 weeks gestation of pregnancy: Secondary | ICD-10-CM | POA: Diagnosis not present

## 2018-09-18 DIAGNOSIS — Z712 Person consulting for explanation of examination or test findings: Secondary | ICD-10-CM

## 2018-09-18 DIAGNOSIS — E349 Endocrine disorder, unspecified: Secondary | ICD-10-CM | POA: Diagnosis present

## 2018-09-18 DIAGNOSIS — O3680X0 Pregnancy with inconclusive fetal viability, not applicable or unspecified: Secondary | ICD-10-CM | POA: Diagnosis not present

## 2018-09-18 NOTE — Progress Notes (Signed)
Patient presents to office today for viability ultrasound results. Reviewed ultrasound with Venia Carbon who finds single living IUP, patient should begin prenatal care.  Informed patient of results, provided pictures, & reviewed dating. Recommended she begin OB care. Patient verbalized understanding & had no questions.

## 2018-09-19 NOTE — Progress Notes (Signed)
I agree with the RN's documentation and plan of care. I reviewed the ultrasound results with the RN.   Duane Lope, NP 09/19/18 9:53 AM

## 2018-11-04 ENCOUNTER — Ambulatory Visit (INDEPENDENT_AMBULATORY_CARE_PROVIDER_SITE_OTHER): Payer: Commercial Managed Care - PPO | Admitting: Student

## 2018-11-04 ENCOUNTER — Encounter: Payer: Self-pay | Admitting: Student

## 2018-11-04 DIAGNOSIS — Z1151 Encounter for screening for human papillomavirus (HPV): Secondary | ICD-10-CM | POA: Diagnosis not present

## 2018-11-04 DIAGNOSIS — O0990 Supervision of high risk pregnancy, unspecified, unspecified trimester: Secondary | ICD-10-CM

## 2018-11-04 DIAGNOSIS — Z113 Encounter for screening for infections with a predominantly sexual mode of transmission: Secondary | ICD-10-CM | POA: Diagnosis not present

## 2018-11-04 DIAGNOSIS — Z124 Encounter for screening for malignant neoplasm of cervix: Secondary | ICD-10-CM

## 2018-11-04 DIAGNOSIS — O09899 Supervision of other high risk pregnancies, unspecified trimester: Secondary | ICD-10-CM

## 2018-11-04 DIAGNOSIS — O09219 Supervision of pregnancy with history of pre-term labor, unspecified trimester: Secondary | ICD-10-CM

## 2018-11-04 NOTE — Progress Notes (Signed)
New OB packet given

## 2018-11-04 NOTE — BH Specialist Note (Deleted)
Integrated Behavioral Health Initial Visit  MRN: [id number] Name: Veronica Leavellmanda Keeran  Number of Integrated Behavioral Health Clinician visits:: 1/6 Session Start time: ***  Session End time: *** Total time: {IBH Total Time:21014050}  Type of Service: Integrated Behavioral Health- Individual/Family Interpretor:No. Interpretor Name and Language: n/a   Warm Hand Off Completed.       SUBJECTIVE: Veronica Torres is a 32 y.o. female accompanied by {CHL AMB ACCOMPANIED NW:[id number]}BY:[phone]} Patient was referred by Luna KitchensKathryn Kooistra, CNM for Initial OB introduction to integrated behavioral health services . Patient reports the following symptoms/concerns: *** Duration of problem: ***; Severity of problem: {Mild/Moderate/Severe:20260}  OBJECTIVE: Mood: {BHH MOOD:22306} and Affect: {BHH AFFECT:22307} Risk of harm to self or others: {CHL AMB BH Suicide Current Mental Status:21022748}  LIFE CONTEXT: Family and Social: *** School/Work: *** Self-Care: *** Life Changes: Current pregnancy ***  GOALS ADDRESSED: Patient will: 1. Reduce symptoms of: {IBH Symptoms:21014056} 2. Increase knowledge and/or ability of: {IBH Patient Tools:21014057}  3. Demonstrate ability to: {IBH Goals:21014053}  INTERVENTIONS: Interventions utilized: {IBH Interventions:21014054}  Standardized Assessments completed: GAD-7 and PHQ 9  ASSESSMENT: Patient currently experiencing Supervision of *** pregnancy, ***.   Patient may benefit from Initial OB introduction to integrated behavioral health services .  PLAN: 1. Follow up with behavioral health clinician on : *** 2. Behavioral recommendations: *** 3. Referral(s): {IBH Referrals:21014055} 4. "From scale of 1-10, how likely are you to follow plan?": ***  Valetta CloseJamie C Fatina Sprankle, LCSW  ***

## 2018-11-04 NOTE — Patient Instructions (Signed)

## 2018-11-04 NOTE — Progress Notes (Signed)
  Subjective:    Veronica Torres is being seen today for her first obstetrical visit.  This is a planned pregnancy. She is at 1530w6d gestation. Her obstetrical history is significant for preterm delivery at 36 weeks; second child was born post-dates. . Relationship with FOB: spouse, living together. Patient does intend to breast feed. Pregnancy history fully reviewed.  Patient reports no complaints.  Review of Systems:   Review of Systems  Constitutional: Negative.   HENT: Negative.   Respiratory: Negative.   Cardiovascular: Negative.   Gastrointestinal: Negative.   Genitourinary: Negative.   Musculoskeletal: Negative.   Neurological: Negative.     Objective:     BP 122/74   Pulse 85   Wt 214 lb 1.6 oz (97.1 kg)   LMP 07/23/2018 (Exact Date)   BMI 36.75 kg/m  Physical Exam  Constitutional: She is oriented to person, place, and time. She appears well-developed and well-nourished.  HENT:  Head: Normocephalic.  Neck: Normal range of motion.  GI: Soft.  Musculoskeletal: Normal range of motion.  Neurological: She is alert and oriented to person, place, and time.  Skin: Skin is warm and dry.  Psychiatric: She has a normal mood and affect.    Exam    Assessment:    Pregnancy: [id number] Patient Active Problem List   Diagnosis Date Noted  . Supervision of high risk pregnancy, antepartum 11/04/2018  . Encounter for pregnancy test, result unknown 08/15/2018  . History of miscarriage 08/15/2018  . NSVD (normal spontaneous vaginal delivery) 05/10/2016  . Active labor at term 05/10/2016  . History of preterm delivery, currently pregnant 11/30/2015       Plan:     Initial labs drawn. Prenatal vitamins. Problem list reviewed and updated. AFP3 discussed: declined. Role of ultrasound in pregnancy discussed; fetal survey: requested. Amniocentesis discussed: not indicated. Follow up in 4 weeks. 50 % of 20  min visit spent on counseling and coordination of care.  -Welcomed  patient to practice, pap smear done.  -Patient wants to do LAM; this worked with her daughter and her children are 22 months apart.  -Patient does not want genetic testing -Patient declined 17P today -Return to clinic in 4 weeks   Veronica GaribaldiKathryn Lorraine [facility] 11/04/2018

## 2018-11-05 LAB — CYTOLOGY - PAP
Chlamydia: NEGATIVE
Diagnosis: NEGATIVE
HPV: NOT DETECTED
Neisseria Gonorrhea: NEGATIVE

## 2018-11-06 LAB — CULTURE, OB URINE

## 2018-11-06 LAB — URINE CULTURE, OB REFLEX: Organism ID, Bacteria: NO GROWTH

## 2018-11-21 LAB — HEMOGLOBINOPATHY EVALUATION
FERRITIN: 13 ng/mL — AB (ref 15–150)
HGB C: 0 %
HGB VARIANT: 0 %
Hgb A2 Quant: 2.2 % (ref 1.8–3.2)
Hgb A: 97.8 % (ref 96.4–98.8)
Hgb F Quant: 0 % (ref 0.0–2.0)
Hgb S: 0 %
Hgb Solubility: NEGATIVE

## 2018-11-21 LAB — SMN1 COPY NUMBER ANALYSIS (SMA CARRIER SCREENING)

## 2018-11-21 LAB — OBSTETRIC PANEL, INCLUDING HIV
Antibody Screen: NEGATIVE
BASOS ABS: 0 10*3/uL (ref 0.0–0.2)
Basos: 0 %
EOS (ABSOLUTE): 0 10*3/uL (ref 0.0–0.4)
Eos: 1 %
HEMATOCRIT: 34.5 % (ref 34.0–46.6)
HIV SCREEN 4TH GENERATION: NONREACTIVE
Hemoglobin: 11.6 g/dL (ref 11.1–15.9)
Hepatitis B Surface Ag: NEGATIVE
Immature Grans (Abs): 0 10*3/uL (ref 0.0–0.1)
Immature Granulocytes: 0 %
LYMPHS: 25 %
Lymphocytes Absolute: 1.7 10*3/uL (ref 0.7–3.1)
MCH: 26.9 pg (ref 26.6–33.0)
MCHC: 33.6 g/dL (ref 31.5–35.7)
MCV: 80 fL (ref 79–97)
Monocytes Absolute: 0.4 10*3/uL (ref 0.1–0.9)
Monocytes: 6 %
NEUTROS ABS: 4.4 10*3/uL (ref 1.4–7.0)
Neutrophils: 68 %
PLATELETS: 248 10*3/uL (ref 150–450)
RBC: 4.31 x10E6/uL (ref 3.77–5.28)
RDW: 13.7 % (ref 12.3–15.4)
RPR Ser Ql: NONREACTIVE
Rh Factor: POSITIVE
Rubella Antibodies, IGG: 1.67 index (ref >0.99)
WBC: 6.5 10*3/uL (ref 3.4–10.8)

## 2018-11-21 LAB — CYSTIC FIBROSIS GENE TEST

## 2018-11-21 LAB — HEMOGLOBIN A1C
Est. average glucose Bld gHb Est-mCnc: 120 mg/dL
Hgb A1c MFr Bld: 5.8 % — ABNORMAL HIGH (ref 4.8–5.6)

## 2018-11-25 ENCOUNTER — Encounter (HOSPITAL_COMMUNITY): Payer: Self-pay

## 2018-12-04 ENCOUNTER — Ambulatory Visit (HOSPITAL_COMMUNITY)
Admission: RE | Admit: 2018-12-04 | Discharge: 2018-12-04 | Disposition: A | Discharge status: Home / Self Care | Payer: Commercial Managed Care - PPO | Source: Ambulatory Visit | Attending: Student | Admitting: Student

## 2018-12-04 ENCOUNTER — Other Ambulatory Visit: Payer: Self-pay | Admitting: Student

## 2018-12-04 ENCOUNTER — Encounter: Payer: Self-pay | Admitting: Student

## 2018-12-04 DIAGNOSIS — Z3A19 19 weeks gestation of pregnancy: Secondary | ICD-10-CM | POA: Diagnosis not present

## 2018-12-04 DIAGNOSIS — O0990 Supervision of high risk pregnancy, unspecified, unspecified trimester: Secondary | ICD-10-CM

## 2018-12-04 DIAGNOSIS — Z363 Encounter for antenatal screening for malformations: Secondary | ICD-10-CM | POA: Diagnosis present

## 2018-12-04 DIAGNOSIS — O99212 Obesity complicating pregnancy, second trimester: Secondary | ICD-10-CM | POA: Diagnosis not present

## 2018-12-04 DIAGNOSIS — O09212 Supervision of pregnancy with history of pre-term labor, second trimester: Secondary | ICD-10-CM | POA: Diagnosis not present

## 2018-12-09 ENCOUNTER — Ambulatory Visit (INDEPENDENT_AMBULATORY_CARE_PROVIDER_SITE_OTHER): Payer: Commercial Managed Care - PPO | Admitting: Nurse Practitioner

## 2018-12-09 VITALS — BP 110/74 | HR 80 | Wt 214.0 lb

## 2018-12-09 DIAGNOSIS — O09219 Supervision of pregnancy with history of pre-term labor, unspecified trimester: Secondary | ICD-10-CM

## 2018-12-09 DIAGNOSIS — O09899 Supervision of other high risk pregnancies, unspecified trimester: Secondary | ICD-10-CM

## 2018-12-09 DIAGNOSIS — O09212 Supervision of pregnancy with history of pre-term labor, second trimester: Secondary | ICD-10-CM

## 2018-12-09 DIAGNOSIS — O0992 Supervision of high risk pregnancy, unspecified, second trimester: Secondary | ICD-10-CM

## 2018-12-09 DIAGNOSIS — O0990 Supervision of high risk pregnancy, unspecified, unspecified trimester: Secondary | ICD-10-CM

## 2018-12-09 MED ORDER — VITAFOL GUMMIES 3.33-0.333-34.8 MG PO CHEW: 3.0000 | CHEWABLE_TABLET | Freq: Every day | ORAL | 11 refills | Status: AC

## 2018-12-09 NOTE — Progress Notes (Signed)
    Subjective:  Veronica Torres is a 32 y.o. [id number] at 5177w6d being seen today for ongoing prenatal care.  She is currently monitored for the following issues for this high-risk pregnancy and has History of preterm delivery, currently pregnant; History of miscarriage; and Supervision of high risk pregnancy, antepartum on their problem list.  Patient reports no complaints.  Contractions: Not present. Vag. Bleeding: None.  Movement: Present. Denies leaking of fluid.   The following portions of the patient's history were reviewed and updated as appropriate: allergies, current medications, past family history, past medical history, past social history, past surgical history and problem list. Problem list updated.  Objective:   Vitals:   12/09/18 0930  BP: 110/74  Pulse: 80  Weight: 214 lb (97.1 kg)    Fetal Status: Fetal Heart Rate (bpm): 154 Fundal Height: 21 cm Movement: Present     General:  Alert, oriented and cooperative. Patient is in no acute distress.  Skin: Skin is warm and dry. No rash noted.   Cardiovascular: Normal heart rate noted  Respiratory: Normal respiratory effort, no problems with respiration noted  Abdomen: Soft, gravid, appropriate for gestational age. Pain/Pressure: Present     Pelvic:  Cervical exam deferred        Extremities: Normal range of motion.  Edema: None  Mental Status: Normal mood and affect. Normal behavior. Normal judgment and thought content.   Urinalysis:      Assessment and Plan:  Pregnancy: W2N5621G4P1112 at 177w6d  1. Supervision of high risk pregnancy, antepartum High risk due to previous delivery at 36 weeks 11 years ago  2. History of preterm delivery, currently pregnant Declined 17P - first child 11 years ago was born at 4436 weeks, second child - no 17P and was term.  Cervix on anatomy US was long and thick  Preterm labor symptoms and general obstetric precautions including but not limited to vaginal bleeding, contractions, leaking of fluid and  fetal movement were reviewed in detail with the patient. Please refer to After Visit Summary for other counseling recommendations.  Return in about 4 weeks (around 01/06/2019).  Nolene BernheimERRI BURLESON, RN, MSN, NP-BC Nurse Practitioner, [facility] for [employer], [facility] 12/09/2018 1:08 PM

## 2018-12-09 NOTE — Patient Instructions (Signed)

## 2019-01-06 ENCOUNTER — Encounter: Payer: Self-pay | Admitting: Student

## 2019-01-12 ENCOUNTER — Encounter: Payer: Self-pay | Admitting: Advanced Practice Midwife

## 2019-01-12 ENCOUNTER — Ambulatory Visit (INDEPENDENT_AMBULATORY_CARE_PROVIDER_SITE_OTHER): Payer: Commercial Managed Care - PPO | Admitting: Advanced Practice Midwife

## 2019-01-12 VITALS — BP 126/75 | HR 97 | Wt 219.6 lb

## 2019-01-12 DIAGNOSIS — O0992 Supervision of high risk pregnancy, unspecified, second trimester: Secondary | ICD-10-CM

## 2019-01-12 DIAGNOSIS — O0990 Supervision of high risk pregnancy, unspecified, unspecified trimester: Secondary | ICD-10-CM

## 2019-01-12 NOTE — Progress Notes (Signed)
   PRENATAL VISIT NOTE  Subjective:  Veronica Torres is a 33 y.o. [phone] at [redacted]w[redacted]d being seen today for ongoing prenatal care.  She is currently monitored for the following issues for this low-risk pregnancy and has History of preterm delivery, currently pregnant; History of miscarriage; and Supervision of high risk pregnancy, antepartum on their problem list.  Patient reports no complaints.  Contractions: Not present. Vag. Bleeding: None.  Movement: Present. Denies leaking of fluid.   The following portions of the patient's history were reviewed and updated as appropriate: allergies, current medications, past family history, past medical history, past social history, past surgical history and problem list. Problem list updated.  Objective:   Vitals:   01/12/19 1118  BP: 126/75  Pulse: 97  Weight: 219 lb 9.6 oz (99.6 kg)    Fetal Status: Fetal Heart Rate (bpm): 156 Fundal Height: 24 cm Movement: Present     General:  Alert, oriented and cooperative. Patient is in no acute distress.  Skin: Skin is warm and dry. No rash noted.   Cardiovascular: Normal heart rate noted  Respiratory: Normal respiratory effort, no problems with respiration noted  Abdomen: Soft, gravid, appropriate for gestational age.  Pain/Pressure: Present     Pelvic: Cervical exam deferred        Extremities: Normal range of motion.  Edema: None  Mental Status: Normal mood and affect. Normal behavior. Normal judgment and thought content.   Assessment and Plan:  Pregnancy: [id number] at [redacted]w[redacted]d  1. Supervision of high risk pregnancy, antepartum - Routine care - CBC; Future - HIV Antibody (routine testing w rflx); Future - RPR; Future - Glucose Tolerance, 2 Hours w/1 Hour; Future  Preterm labor symptoms and general obstetric precautions including but not limited to vaginal bleeding, contractions, leaking of fluid and fetal movement were reviewed in detail with the patient. Please refer to After Visit Summary for other  counseling recommendations.  Return in about 4 weeks (around 02/09/2019).  No future appointments.  Thressa Sheller DNP, CNM  01/12/19  11:29 AM

## 2019-02-09 ENCOUNTER — Ambulatory Visit (INDEPENDENT_AMBULATORY_CARE_PROVIDER_SITE_OTHER): Payer: Commercial Managed Care - PPO | Admitting: Obstetrics & Gynecology

## 2019-02-09 ENCOUNTER — Encounter: Payer: Self-pay | Admitting: Obstetrics & Gynecology

## 2019-02-09 DIAGNOSIS — O0993 Supervision of high risk pregnancy, unspecified, third trimester: Secondary | ICD-10-CM

## 2019-02-09 DIAGNOSIS — O0990 Supervision of high risk pregnancy, unspecified, unspecified trimester: Secondary | ICD-10-CM

## 2019-02-09 DIAGNOSIS — Z3A28 28 weeks gestation of pregnancy: Secondary | ICD-10-CM

## 2019-02-09 NOTE — Progress Notes (Signed)
   PRENATAL VISIT NOTE  Subjective:  Veronica Torres is a 33 y.o. [phone] at [redacted]w[redacted]d being seen today for ongoing prenatal care.  She is currently monitored for the following issues for this high-risk pregnancy and has History of preterm delivery, currently pregnant; History of miscarriage; and Supervision of high risk pregnancy, antepartum on their problem list.  Patient reports had right sided pain this morning that lasted 15 min and has resolved with no contractions.  Contractions: Irritability. Vag. Bleeding: None.  Movement: Present. Denies leaking of fluid.   The following portions of the patient's history were reviewed and updated as appropriate: allergies, current medications, past family history, past medical history, past social history, past surgical history and problem list. Problem list updated.  Objective:   Vitals:   02/09/19 1131  BP: 106/62  Pulse: 82  Weight: 222 lb (100.7 kg)    Fetal Status: Fetal Heart Rate (bpm): 145 Fundal Height: 28 cm Movement: Present     General:  Alert, oriented and cooperative. Patient is in no acute distress.  Skin: Skin is warm and dry. No rash noted.   Cardiovascular: Normal heart rate noted  Respiratory: Normal respiratory effort, no problems with respiration noted  Abdomen: Soft, gravid, appropriate for gestational age.  Pain/Pressure: Present     Pelvic: Cervical exam deferred        Extremities: Normal range of motion.  Edema: None  Mental Status: Normal mood and affect. Normal behavior. Normal judgment and thought content.   Assessment and Plan:  Pregnancy: I6O0321 at [redacted]w[redacted]d  1. Supervision of high risk pregnancy, antepartum Discomforts of pregnancy  Preterm labor symptoms and general obstetric precautions including but not limited to vaginal bleeding, contractions, leaking of fluid and fetal movement were reviewed in detail with the patient. Please refer to After Visit Summary for other counseling recommendations.  Return in about  2 weeks (around 02/23/2019) for needs GTT on Friday.  Future Appointments  Date Time Provider Department Center  02/13/2019  8:20 AM [facility]    Scheryl Darter, MD

## 2019-02-09 NOTE — Patient Instructions (Signed)

## 2019-02-13 ENCOUNTER — Other Ambulatory Visit: Payer: Commercial Managed Care - PPO

## 2019-02-13 DIAGNOSIS — O0990 Supervision of high risk pregnancy, unspecified, unspecified trimester: Secondary | ICD-10-CM

## 2019-02-14 LAB — CBC
Hematocrit: 34.1 % (ref 34.0–46.6)
Hemoglobin: 10.6 g/dL — ABNORMAL LOW (ref 11.1–15.9)
MCH: 25.7 pg — ABNORMAL LOW (ref 26.6–33.0)
MCHC: 31.1 g/dL — AB (ref 31.5–35.7)
MCV: 83 fL (ref 79–97)
Platelets: 232 10*3/uL (ref 150–450)
RBC: 4.12 x10E6/uL (ref 3.77–5.28)
RDW: 13.8 % (ref 11.7–15.4)
WBC: 7.2 10*3/uL (ref 3.4–10.8)

## 2019-02-14 LAB — RPR: RPR Ser Ql: NONREACTIVE

## 2019-02-14 LAB — GLUCOSE TOLERANCE, 2 HOURS W/ 1HR
GLUCOSE, FASTING: 114 mg/dL — AB (ref 65–91)
Glucose, 1 hour: 249 mg/dL — ABNORMAL HIGH (ref 65–179)
Glucose, 2 hour: 226 mg/dL — ABNORMAL HIGH (ref 65–152)

## 2019-02-14 LAB — HIV ANTIBODY (ROUTINE TESTING W REFLEX): HIV Screen 4th Generation wRfx: NONREACTIVE

## 2019-02-16 ENCOUNTER — Telehealth: Payer: Self-pay | Admitting: *Deleted

## 2019-02-16 NOTE — Telephone Encounter (Signed)
Called pt to inform her that she has gestational diabetes. Advised her that front office staff would be contacting her to schedule an appointment with the diabetes educator.  Pt verbalized understanding.

## 2019-02-16 NOTE — Telephone Encounter (Signed)
-----   Message from Armando Reichert, CNM sent at 02/16/2019  8:14 AM EDT ----- Patient has gestational diabetes.Please call her schedule her with the diabetes educator.

## 2019-02-23 ENCOUNTER — Telehealth: Payer: Self-pay | Admitting: Family Medicine

## 2019-02-23 NOTE — Telephone Encounter (Signed)
Called patient to inform about the restrictions at the office due to the coronavirus. Patient verbalized understanding. 

## 2019-02-24 ENCOUNTER — Encounter: Payer: Self-pay | Admitting: Obstetrics & Gynecology

## 2019-02-24 ENCOUNTER — Encounter: Payer: Self-pay | Admitting: Student

## 2019-02-24 ENCOUNTER — Other Ambulatory Visit: Payer: Self-pay

## 2019-03-05 ENCOUNTER — Encounter (HOSPITAL_COMMUNITY): Payer: Self-pay | Admitting: *Deleted

## 2019-03-05 ENCOUNTER — Other Ambulatory Visit: Payer: Self-pay

## 2019-03-05 ENCOUNTER — Encounter: Payer: Self-pay | Admitting: Obstetrics and Gynecology

## 2019-03-05 ENCOUNTER — Inpatient Hospital Stay (HOSPITAL_COMMUNITY)
Admission: AD | Admit: 2019-03-05 | Discharge: 2019-03-05 | Disposition: A | Discharge status: Home / Self Care | Payer: Commercial Managed Care - PPO | Attending: Obstetrics and Gynecology | Admitting: Obstetrics and Gynecology

## 2019-03-05 ENCOUNTER — Telehealth: Payer: Self-pay | Admitting: Obstetrics and Gynecology

## 2019-03-05 DIAGNOSIS — Z3689 Encounter for other specified antenatal screening: Secondary | ICD-10-CM

## 2019-03-05 DIAGNOSIS — Z3A32 32 weeks gestation of pregnancy: Secondary | ICD-10-CM | POA: Insufficient documentation

## 2019-03-05 DIAGNOSIS — O368130 Decreased fetal movements, third trimester, not applicable or unspecified: Secondary | ICD-10-CM | POA: Insufficient documentation

## 2019-03-05 NOTE — MAU Provider Note (Signed)
History   [id number]   Chief Complaint  Patient presents with  . Decreased Fetal Movement    HPI Veronica Torres is a 33 y.o. female  [phone] here with report of decreased fetal movement since last night. Was supposed to have ob visit this morning but was told to come to MAU after she called them & said she had decreased movement. Initially had no movement since last night then felt flutters not long before arriving to MAU.  Denies contractions, vaginal bleeding or leaking of fluid.    Patient's last menstrual period was 07/23/2018 (exact date).  OB History  Gravida Para Term Preterm AB Living  4 2 1 1 1 2   SAB TAB Ectopic Multiple Live Births  1     0 2    # Outcome Date GA Lbr Len/2nd Weight Sex Delivery Anes PTL Lv  4 Current           3 SAB 03/2018 [redacted]w[redacted]d         2 Term 05/10/16 [redacted]w[redacted]d 20:20 / 00:45 3090 g F Vag-Spont EPI  LIV  1 Preterm 04/23/07 [redacted]w[redacted]d  2693 g M Vag-Spont EPI N LIV    Past Medical History:  Diagnosis Date  . Medical history non-contributory     History reviewed. No pertinent family history.  Social History   Socioeconomic History  . Marital status: Married    Spouse name: Not on file  . Number of children: Not on file  . Years of education: Not on file  . Highest education level: Not on file  Occupational History  . Not on file  Social Needs  . Financial resource strain: Not on file  . Food insecurity:    Worry: Not on file    Inability: Not on file  . Transportation needs:    Medical: Not on file    Non-medical: Not on file  Tobacco Use  . Smoking status: Never Smoker  . Smokeless tobacco: Never Used  Substance and Sexual Activity  . Alcohol use: No  . Drug use: No  . Sexual activity: Yes    Birth control/protection: None  Lifestyle  . Physical activity:    Days per week: Not on file    Minutes per session: Not on file  . Stress: Not on file  Relationships  . Social connections:    Talks on phone: Not on file    Gets together: Not on  file    Attends religious service: Not on file    Active member of club or organization: Not on file    Attends meetings of clubs or organizations: Not on file    Relationship status: Not on file  Other Topics Concern  . Not on file  Social History Narrative  . Not on file    No Known Allergies  No current facility-administered medications on file prior to encounter.    Current Outpatient Medications on File Prior to Encounter  Medication Sig Dispense Refill  . Prenatal Vit-Fe Phos-FA-Omega (VITAFOL GUMMIES) 3.33-0.333-34.8 MG CHEW Chew 3 each by mouth daily. 90 tablet 11     Review of Systems  Constitutional: Negative.   Gastrointestinal: Negative.   Genitourinary: Negative.    Physical Exam   Vitals:   03/05/19 1536 03/05/19 1628 03/05/19 1633  BP: 113/63 115/70 115/70  Pulse: 84 86 80  Resp:   16  Temp: 98.4 F (36.9 C)    SpO2: 100%      Physical Exam  Nursing note and vitals  reviewed. Constitutional: She appears well-developed and well-nourished. No distress.  HENT:  Head: Normocephalic and atraumatic.  Respiratory: Effort normal. No respiratory distress.  GI: Soft. There is no abdominal tenderness.  Skin: Skin is warm and dry. She is not diaphoretic.    MAU Course  Procedures  MDM NST:  Baseline: 140 bpm, Variability: Good {> 6 bpm), Accelerations: Reactive and Decelerations: Absent  Reactive NST with movement noted on monitor. Pt reports feeling movement.  Assessment and Plan  A: 1. Decreased fetal movements in third trimester, single or unspecified fetus   2. [redacted] weeks gestation of pregnancy   3. NST (non-stress test) reactive    P: Discharge home Call office to reschedule appts asap Discussed reasons to return to MAU & given fetal movement form    Judeth Horn, NP 03/05/2019 4:55 PM

## 2019-03-05 NOTE — Telephone Encounter (Signed)
The patient called and stated she can not feel her baby move. She asked if she should come to our office and go upstairs. Informed the patient of the address of the hospital. She stated the last time she felt the baby move was last night.

## 2019-03-05 NOTE — Discharge Instructions (Signed)

## 2019-03-05 NOTE — MAU Note (Signed)
Pt presents to MAU with complaints of decrease in fetal movement since yesterday. Denies any pain

## 2019-03-11 ENCOUNTER — Ambulatory Visit: Payer: Commercial Managed Care - PPO | Admitting: *Deleted

## 2019-03-11 ENCOUNTER — Ambulatory Visit (INDEPENDENT_AMBULATORY_CARE_PROVIDER_SITE_OTHER): Payer: Commercial Managed Care - PPO | Admitting: Obstetrics & Gynecology

## 2019-03-11 ENCOUNTER — Ambulatory Visit: Payer: Self-pay

## 2019-03-11 ENCOUNTER — Other Ambulatory Visit: Payer: Self-pay

## 2019-03-11 ENCOUNTER — Encounter: Payer: Commercial Managed Care - PPO | Attending: Obstetrics & Gynecology | Admitting: *Deleted

## 2019-03-11 DIAGNOSIS — O99210 Obesity complicating pregnancy, unspecified trimester: Secondary | ICD-10-CM | POA: Insufficient documentation

## 2019-03-11 DIAGNOSIS — O99213 Obesity complicating pregnancy, third trimester: Secondary | ICD-10-CM

## 2019-03-11 DIAGNOSIS — O09213 Supervision of pregnancy with history of pre-term labor, third trimester: Secondary | ICD-10-CM | POA: Diagnosis not present

## 2019-03-11 DIAGNOSIS — O24419 Gestational diabetes mellitus in pregnancy, unspecified control: Secondary | ICD-10-CM | POA: Diagnosis not present

## 2019-03-11 DIAGNOSIS — Z3A33 33 weeks gestation of pregnancy: Secondary | ICD-10-CM

## 2019-03-11 DIAGNOSIS — O09219 Supervision of pregnancy with history of pre-term labor, unspecified trimester: Secondary | ICD-10-CM

## 2019-03-11 DIAGNOSIS — O09899 Supervision of other high risk pregnancies, unspecified trimester: Secondary | ICD-10-CM

## 2019-03-11 DIAGNOSIS — O0990 Supervision of high risk pregnancy, unspecified, unspecified trimester: Secondary | ICD-10-CM

## 2019-03-11 DIAGNOSIS — O0993 Supervision of high risk pregnancy, unspecified, third trimester: Secondary | ICD-10-CM

## 2019-03-11 MED ORDER — GLUCOSE BLOOD VI STRP: ORAL_STRIP | 12 refills | Status: DC

## 2019-03-11 MED ORDER — ONETOUCH VERIO IQ SYSTEM W/DEVICE KIT: 1.0000 | PACK | Freq: Four times a day (QID) | 0 refills | Status: DC | PRN

## 2019-03-11 MED ORDER — ACCU-CHEK FASTCLIX LANCETS MISC: 1.0000 [IU] | Freq: Four times a day (QID) | 12 refills | Status: DC

## 2019-03-11 NOTE — Progress Notes (Signed)
  Patient was seen on 03/11/2019 for Gestational Diabetes self-management. EDD 04/29/2019. Patient states no history of GDM. Patient lived in [place] and is used to walking a lot but has not been able to be active with this pregnancy due to pelvic pain. Diet history obtained. Patient eats good variety of all food groups but limited complete proteins. Beverages include milk, water and occasionally OJ.  The following learning objectives were met by the patient :   States when to check blood glucose levels  Demonstrates proper blood glucose monitoring techniques  States the effect of stress and exercise on blood glucose levels  States the importance of limiting caffeine and abstaining from alcohol and smoking  This was an abbreviated visit provided during a NST. Plan to call patient tomorrow to complete this education.   States the definition of Gestational Diabetes  States why dietary management is important in controlling blood glucose  Describes the effects of carbohydrates on blood glucose levels  Demonstrates ability to create a balanced meal plan  Demonstrates carbohydrate counting   Plan:  Aim for 3 Carb Choices per meal (45 grams) +/- 1 either way  Aim for 1-2 Carbs per snack Begin reading food labels for Total Carbohydrate of foods If OK with your MD, consider  increasing your activity level by walking, Arm Chair Exercises or other activity daily as tolerated Begin checking BG before breakfast and 2 hours after first bite of breakfast, lunch and dinner as directed by MD  Bring Log Book/Sheet and meter to every medical appointment OR use Baby Scripts (see below) Baby Scripts:  Patient was introduced to [employer] and plans to use as record of BG electronically  Take medication if directed by MD  Blood glucose monitor Rx called into pharmacy: Accu Check Guide with Fast Clix drums Patient instructed to test pre breakfast and 2 hours each meal as directed by MD  Patient  instructed to monitor glucose levels: FBS: 60 - 95 mg/dl 2 hour: <120 mg/dl  Patient received the following handouts:  Nutrition Diabetes and Pregnancy  Carbohydrate Counting List  BG Log Sheet  Patient will be called for follow-up in the AM and seen or called as needed.

## 2019-03-11 NOTE — Progress Notes (Signed)
   TELEHEALTH VIRTUAL OBSTETRICS VISIT ENCOUNTER NOTE  I connected with Damian Leavell on 03/11/19 at  1:55 PM EDT by telephone at home and verified that I am speaking with the correct person using two identifiers.   I discussed the limitations, risks, security and privacy concerns of performing an evaluation and management service by telephone and the availability of in person appointments. I also discussed with the patient that there may be a patient responsible charge related to this service. The patient expressed understanding and agreed to proceed.  Subjective:  Veronica Torres is a 33 y.o. [phone] (2 and 57 yo kids)  at [redacted]w[redacted]d being followed for ongoing prenatal care.  She is currently monitored for the following issues for this high-risk pregnancy and has History of preterm delivery, currently pregnant; History of miscarriage; Supervision of high risk pregnancy, antepartum; Obesity in pregnancy; and Gestational diabetes on their problem list.  Patient reports no complaints. Reports fetal movement. Denies any contractions, bleeding or leaking of fluid.   The following portions of the patient's history were reviewed and updated as appropriate: allergies, current medications, past family history, past medical history, past social history, past surgical history and problem list.   Objective:   General:  Alert, oriented and cooperative.   Mental Status: Normal mood and affect perceived. Normal judgment and thought content.  Rest of physical exam deferred due to type of encounter  Assessment and Plan:  Pregnancy: [id number] at [redacted]w[redacted]d 1. Supervision of high risk pregnancy, antepartum   2. History of preterm delivery, currently pregnant   3. Obesity in pregnancy - she has gained 20 pounds. I rec'd not much more.  4. Gestational diabetes mellitus (GDM) in third trimester, gestational diabetes method of control unspecified - She has not had an appt with diabetes management so Bev is willing to see  her today - Hemoglobin A1c; Future  Preterm labor symptoms and general obstetric precautions including but not limited to vaginal bleeding, contractions, leaking of fluid and fetal movement were reviewed in detail with the patient.  I discussed the assessment and treatment plan with the patient. The patient was provided an opportunity to ask questions and all were answered. The patient agreed with the plan and demonstrated an understanding of the instructions. The patient was advised to call back or seek an in-person office evaluation/go to MAU at [facility] for any urgent or concerning symptoms. Please refer to After Visit Summary for other counseling recommendations.   I provided 10 minutes of non-face-to-face time during this encounter.  No follow-ups on file.  Future Appointments  Date Time Provider Department Center  03/11/2019  1:55 PM Allie Bossier, MD [facility] WOC    Allie Bossier, MD Center for [employer], [facility]

## 2019-03-11 NOTE — Addendum Note (Signed)
Addended by: Jill Side on: 03/11/2019 04:35 PM   Modules accepted: Orders

## 2019-03-11 NOTE — Addendum Note (Signed)
Addended by: Allie Bossier on: 03/11/2019 03:05 PM   Modules accepted: Orders

## 2019-03-12 ENCOUNTER — Telehealth: Payer: Self-pay | Admitting: Family Medicine

## 2019-03-12 NOTE — Telephone Encounter (Signed)
The patient stated she missed a call from education. Would like a call back.

## 2019-03-13 ENCOUNTER — Telehealth: Payer: Self-pay | Admitting: Obstetrics and Gynecology

## 2019-03-13 NOTE — Telephone Encounter (Signed)
Called the patient in regards to the restrictions due to the COVID19. No questions at this time.

## 2019-03-16 ENCOUNTER — Ambulatory Visit (INDEPENDENT_AMBULATORY_CARE_PROVIDER_SITE_OTHER): Payer: Commercial Managed Care - PPO | Admitting: *Deleted

## 2019-03-16 ENCOUNTER — Ambulatory Visit (INDEPENDENT_AMBULATORY_CARE_PROVIDER_SITE_OTHER): Payer: Commercial Managed Care - PPO | Admitting: Family Medicine

## 2019-03-16 ENCOUNTER — Ambulatory Visit: Payer: Self-pay

## 2019-03-16 ENCOUNTER — Other Ambulatory Visit: Payer: Self-pay

## 2019-03-16 VITALS — BP 112/58 | HR 80 | Temp 98.3°F | Wt 222.0 lb

## 2019-03-16 DIAGNOSIS — O09219 Supervision of pregnancy with history of pre-term labor, unspecified trimester: Secondary | ICD-10-CM

## 2019-03-16 DIAGNOSIS — O09899 Supervision of other high risk pregnancies, unspecified trimester: Secondary | ICD-10-CM

## 2019-03-16 DIAGNOSIS — O0990 Supervision of high risk pregnancy, unspecified, unspecified trimester: Secondary | ICD-10-CM

## 2019-03-16 DIAGNOSIS — O24419 Gestational diabetes mellitus in pregnancy, unspecified control: Secondary | ICD-10-CM

## 2019-03-16 DIAGNOSIS — O09213 Supervision of pregnancy with history of pre-term labor, third trimester: Secondary | ICD-10-CM

## 2019-03-16 DIAGNOSIS — Z3A35 35 weeks gestation of pregnancy: Secondary | ICD-10-CM

## 2019-03-16 NOTE — Progress Notes (Signed)

## 2019-03-16 NOTE — Progress Notes (Signed)
Subjective:  Veronica Torres is a 33 y.o. [phone] at [redacted]w[redacted]d being seen today for ongoing prenatal care.  She is currently monitored for the following issues for this high-risk pregnancy and has History of preterm delivery, currently pregnant; History of miscarriage; Supervision of high risk pregnancy, antepartum; Obesity in pregnancy; and Gestational diabetes on their problem list.  GDM: Patient diet controlled. Only started testing yesterday due to difficulty getting testing supplies. Fasting: 1 elevated 2hr PP: 1 elevated  Patient reports no complaints.  Contractions: Not present. Vag. Bleeding: None.  Movement: Present. Denies leaking of fluid.   The following portions of the patient's history were reviewed and updated as appropriate: allergies, current medications, past family history, past medical history, past social history, past surgical history and problem list. Problem list updated.  Objective:   Vitals:   03/16/19 1323  BP: (!) 112/58  Pulse: 80  Temp: 98.3 F (36.8 C)  Weight: 222 lb (100.7 kg)    Fetal Status: Fetal Heart Rate (bpm): NST   Movement: Present     General:  Alert, oriented and cooperative. Patient is in no acute distress.  Skin: Skin is warm and dry. No rash noted.   Cardiovascular: Normal heart rate noted  Respiratory: Normal respiratory effort, no problems with respiration noted  Abdomen: Soft, gravid, appropriate for gestational age. Pain/Pressure: Present     Pelvic: Vag. Bleeding: None     Cervical exam deferred        Extremities: Normal range of motion.  Edema: None  Mental Status: Normal mood and affect. Normal behavior. Normal judgment and thought content.   Urinalysis:      Assessment and Plan:  Pregnancy: [id number] at [redacted]w[redacted]d  1. Supervision of high risk pregnancy, antepartum FHT and FH normal  2. Gestational diabetes mellitus (GDM) in third trimester, gestational diabetes method of control unspecified Continue testing. BPP 8/10 At this point,  still diet controlled - will hold off on further testing until indicated.  3. History of preterm delivery, currently pregnant   Preterm labor symptoms and general obstetric precautions including but not limited to vaginal bleeding, contractions, leaking of fluid and fetal movement were reviewed in detail with the patient. Please refer to After Visit Summary for other counseling recommendations.  Return in about 1 week (around 03/23/2019) for HR OB f/u, video visit; 2 weeks HOB in office.   Levie Heritage, DO

## 2019-03-16 NOTE — Patient Instructions (Signed)
Please download free app: [employer] WebEx

## 2019-03-19 ENCOUNTER — Telehealth: Payer: Self-pay | Admitting: Obstetrics and Gynecology

## 2019-03-19 NOTE — Telephone Encounter (Signed)
Called the patient to inform of the virtual visit. She now has the app downloaded.

## 2019-03-23 ENCOUNTER — Encounter: Payer: Commercial Managed Care - PPO | Admitting: Obstetrics and Gynecology

## 2019-03-24 ENCOUNTER — Encounter: Payer: Self-pay | Admitting: General Practice

## 2019-03-24 NOTE — Progress Notes (Unsigned)
Patient triggered in babyscripts for elevated blood sugars. Per chart review, patient has video visit with Dr Marice Potter on Wednesday 4/15. Will route to Dr Marice Potter.

## 2019-03-25 ENCOUNTER — Other Ambulatory Visit: Payer: Self-pay

## 2019-03-25 ENCOUNTER — Ambulatory Visit (INDEPENDENT_AMBULATORY_CARE_PROVIDER_SITE_OTHER): Payer: Commercial Managed Care - PPO | Admitting: Obstetrics & Gynecology

## 2019-03-25 DIAGNOSIS — O0990 Supervision of high risk pregnancy, unspecified, unspecified trimester: Secondary | ICD-10-CM

## 2019-03-25 DIAGNOSIS — O99210 Obesity complicating pregnancy, unspecified trimester: Secondary | ICD-10-CM

## 2019-03-25 DIAGNOSIS — Z3A35 35 weeks gestation of pregnancy: Secondary | ICD-10-CM

## 2019-03-25 DIAGNOSIS — O24419 Gestational diabetes mellitus in pregnancy, unspecified control: Secondary | ICD-10-CM

## 2019-03-25 DIAGNOSIS — O0993 Supervision of high risk pregnancy, unspecified, third trimester: Secondary | ICD-10-CM

## 2019-03-25 DIAGNOSIS — O99213 Obesity complicating pregnancy, third trimester: Secondary | ICD-10-CM

## 2019-03-25 NOTE — Progress Notes (Signed)
   TELEHEALTH VIRTUAL OBSTETRICS VISIT ENCOUNTER NOTE  I connected with Veronica Torres on 03/25/19 at  2:35 PM EDT by telephone at home and verified that I am speaking with the correct person using two identifiers.   I discussed the limitations, risks, security and privacy concerns of performing an evaluation and management service by telephone and the availability of in person appointments. I also discussed with the patient that there may be a patient responsible charge related to this service. The patient expressed understanding and agreed to proceed.  Subjective:  Veronica Torres is a 33 y.o. [id number] at [redacted]w[redacted]d being followed for ongoing prenatal care.  She is currently monitored for the following issues for this high-risk pregnancy and has History of preterm delivery, currently pregnant; History of miscarriage; Supervision of high risk pregnancy, antepartum; Obesity in pregnancy; and Gestational diabetes on their problem list.  Patient reports no complaints. Reports fetal movement. Denies any contractions, bleeding or leaking of fluid.   The following portions of the patient's history were reviewed and updated as appropriate: allergies, current medications, past family history, past medical history, past social history, past surgical history and problem list.   Objective:   General:  Alert, oriented and cooperative.   Mental Status: Normal mood and affect perceived. Normal judgment and thought content.  Rest of physical exam deferred due to type of encounter  Assessment and Plan:  Pregnancy: [id number] at [redacted]w[redacted]d 1. Supervision of high risk pregnancy, antepartum - cervical cultures next week, already scheduled  2. Gestational diabetes mellitus (GDM) in third trimester, gestational diabetes method of control unspecified - now that she has stopped eating rice and plantains her supper time sugars are perfect - since her fastings are ALWAYS normal, I told her that she no longer needs to check fastings  3. Obesity in pregnancy   Preterm labor symptoms and general obstetric precautions including but not limited to vaginal bleeding, contractions, leaking of fluid and fetal movement were reviewed in detail with the patient.  I discussed the assessment and treatment plan with the patient. The patient was provided an opportunity to ask questions and all were answered. The patient agreed with the plan and demonstrated an understanding of the instructions. The patient was advised to call back or seek an in-person office evaluation/go to MAU at [facility] for any urgent or concerning symptoms. Please refer to After Visit Summary for other counseling recommendations.   I provided 8 minutes of non-face-to-face time during this encounter.  Return in about 1 week (around 04/01/2019).  Future Appointments  Date Time Provider Department Center  03/30/2019  9:15 AM Levie Heritage, DO [facility] WOC    Allie Bossier, MD Center for [employer], [facility]

## 2019-03-30 ENCOUNTER — Other Ambulatory Visit: Payer: Self-pay

## 2019-03-30 ENCOUNTER — Other Ambulatory Visit: Payer: Self-pay | Admitting: Family Medicine

## 2019-03-30 ENCOUNTER — Encounter: Payer: Self-pay | Admitting: Family Medicine

## 2019-03-30 ENCOUNTER — Ambulatory Visit (INDEPENDENT_AMBULATORY_CARE_PROVIDER_SITE_OTHER): Payer: Commercial Managed Care - PPO | Admitting: Family Medicine

## 2019-03-30 VITALS — BP 112/77 | HR 76 | Wt 225.9 lb

## 2019-03-30 DIAGNOSIS — Z3A35 35 weeks gestation of pregnancy: Secondary | ICD-10-CM

## 2019-03-30 DIAGNOSIS — O99213 Obesity complicating pregnancy, third trimester: Secondary | ICD-10-CM

## 2019-03-30 DIAGNOSIS — O09219 Supervision of pregnancy with history of pre-term labor, unspecified trimester: Secondary | ICD-10-CM

## 2019-03-30 DIAGNOSIS — O09899 Supervision of other high risk pregnancies, unspecified trimester: Secondary | ICD-10-CM

## 2019-03-30 DIAGNOSIS — O99210 Obesity complicating pregnancy, unspecified trimester: Secondary | ICD-10-CM

## 2019-03-30 DIAGNOSIS — O0990 Supervision of high risk pregnancy, unspecified, unspecified trimester: Secondary | ICD-10-CM | POA: Diagnosis not present

## 2019-03-30 DIAGNOSIS — O24419 Gestational diabetes mellitus in pregnancy, unspecified control: Secondary | ICD-10-CM

## 2019-03-30 DIAGNOSIS — O09213 Supervision of pregnancy with history of pre-term labor, third trimester: Secondary | ICD-10-CM

## 2019-03-30 DIAGNOSIS — O0993 Supervision of high risk pregnancy, unspecified, third trimester: Secondary | ICD-10-CM

## 2019-03-30 NOTE — Progress Notes (Signed)
Subjective:  Keshawna Couzens is a 33 y.o. [id number] at [redacted]w[redacted]d being seen today for ongoing prenatal care.  She is currently monitored for the following issues for this high-risk pregnancy and has History of preterm delivery, currently pregnant; History of miscarriage; Supervision of high risk pregnancy, antepartum; Obesity in pregnancy; and Gestational diabetes on their problem list.  GDM: Patient diet controlled Fasting: controlled, although missing a few checks 2hr PP: 2 elevated, others normal  Patient reports no complaints.  Contractions: Irritability. Vag. Bleeding: None.  Movement: Present. Denies leaking of fluid.   The following portions of the patient's history were reviewed and updated as appropriate: allergies, current medications, past family history, past medical history, past social history, past surgical history and problem list. Problem list updated.  Objective:   Vitals:   03/30/19 1444  BP: 112/77  Pulse: 76  Weight: 225 lb 14.4 oz (102.5 kg)    Fetal Status: Fetal Heart Rate (bpm): 145 Fundal Height: 37 cm Movement: Present     General:  Alert, oriented and cooperative. Patient is in no acute distress.  Skin: Skin is warm and dry. No rash noted.   Cardiovascular: Normal heart rate noted  Respiratory: Normal respiratory effort, no problems with respiration noted  Abdomen: Soft, gravid, appropriate for gestational age. Pain/Pressure: Absent     Pelvic: Vag. Bleeding: None     Cervical exam deferred        Extremities: Normal range of motion.  Edema: None  Mental Status: Normal mood and affect. Normal behavior. Normal judgment and thought content.   Urinalysis:      Assessment and Plan:  Pregnancy: [id number] at [redacted]w[redacted]d  1. Supervision of high risk pregnancy, antepartum FHT and FH normal - Culture, beta strep (group b only) - GC/Chlamydia probe amp ([place])not at [facility]  2. Gestational diabetes mellitus (GDM) in third trimester, gestational diabetes method of control  unspecified Continue diet control  3. Obesity in pregnancy   4. History of preterm delivery, currently pregnant   Term labor symptoms and general obstetric precautions including but not limited to vaginal bleeding, contractions, leaking of fluid and fetal movement were reviewed in detail with the patient. Please refer to After Visit Summary for other counseling recommendations.  Return in about 2 weeks (around 04/13/2019) for HR OB f/u, virtual.   Levie Heritage, DO

## 2019-04-03 LAB — CULTURE, BETA STREP (GROUP B ONLY): Strep Gp B Culture: NEGATIVE

## 2019-04-04 LAB — SPECIMEN STATUS REPORT

## 2019-04-04 LAB — GC/CHLAMYDIA PROBE AMP
Chlamydia trachomatis, NAA: NEGATIVE
Neisseria Gonorrhoeae by PCR: NEGATIVE

## 2019-04-13 ENCOUNTER — Ambulatory Visit (INDEPENDENT_AMBULATORY_CARE_PROVIDER_SITE_OTHER): Payer: Commercial Managed Care - PPO | Admitting: Family Medicine

## 2019-04-13 DIAGNOSIS — O24419 Gestational diabetes mellitus in pregnancy, unspecified control: Secondary | ICD-10-CM

## 2019-04-13 DIAGNOSIS — O09219 Supervision of pregnancy with history of pre-term labor, unspecified trimester: Secondary | ICD-10-CM

## 2019-04-13 DIAGNOSIS — Z3A37 37 weeks gestation of pregnancy: Secondary | ICD-10-CM

## 2019-04-13 DIAGNOSIS — O09899 Supervision of other high risk pregnancies, unspecified trimester: Secondary | ICD-10-CM

## 2019-04-13 DIAGNOSIS — O09213 Supervision of pregnancy with history of pre-term labor, third trimester: Secondary | ICD-10-CM

## 2019-04-13 DIAGNOSIS — O0990 Supervision of high risk pregnancy, unspecified, unspecified trimester: Secondary | ICD-10-CM

## 2019-04-13 DIAGNOSIS — O0993 Supervision of high risk pregnancy, unspecified, third trimester: Secondary | ICD-10-CM

## 2019-04-13 NOTE — Progress Notes (Signed)
   TELEHEALTH VIRTUAL OBSTETRICS PRENATAL VISIT ENCOUNTER NOTE  I connected with Damian Leavell on 04/13/19 at  3:15 PM EDT by WebEx at home and verified that I am speaking with the correct person using two identifiers.   I discussed the limitations, risks, security and privacy concerns of performing an evaluation and management service by telephone and the availability of in person appointments. I also discussed with the patient that there may be a patient responsible charge related to this service. The patient expressed understanding and agreed to proceed. Subjective:  Veronica Torres is a 33 y.o. [id number] at [redacted]w[redacted]d being seen today for ongoing prenatal care.  She is currently monitored for the following issues for this high-risk pregnancy and has History of preterm delivery, currently pregnant; History of miscarriage; Supervision of high risk pregnancy, antepartum; Obesity in pregnancy; and Gestational diabetes on their problem list.  Patient reports no complaints.  Reports fetal movement. Contractions: Irritability. Vag. Bleeding: None.  Movement: Present. Denies any contractions, bleeding or leaking of fluid.   The following portions of the patient's history were reviewed and updated as appropriate: allergies, current medications, past family history, past medical history, past social history, past surgical history and problem list.   Objective:  There were no vitals filed for this visit.  Fetal Status:     Movement: Present     General:  Alert, oriented and cooperative. Patient is in no acute distress.  Respiratory: Normal respiratory effort, no problems with respiration noted  Mental Status: Normal mood and affect. Normal behavior. Normal judgment and thought content.  Rest of physical exam deferred due to type of encounter  Assessment and Plan:  Pregnancy: [id number] at [redacted]w[redacted]d 1. Supervision of high risk pregnancy, antepartum Fetal movement good. Has had intermittent mild headaches and floaters,  but has had floaters before and not new. No Symptoms now. Not able to come into the office for BP check today. Will send BP cuff to patient so she can start checking BPs. Severe symptoms reviewed with patient.   2. Gestational diabetes mellitus (GDM) in third trimester, gestational diabetes method of control unspecified Controlled  3. History of preterm delivery, currently pregnant   Preterm labor symptoms and general obstetric precautions including but not limited to vaginal bleeding, contractions, leaking of fluid and fetal movement were reviewed in detail with the patient. I discussed the assessment and treatment plan with the patient. The patient was provided an opportunity to ask questions and all were answered. The patient agreed with the plan and demonstrated an understanding of the instructions. The patient was advised to call back or seek an in-person office evaluation/go to MAU at [facility] for any urgent or concerning symptoms. Please refer to After Visit Summary for other counseling recommendations.   I provided 15 minutes of face-to-face via WebEx time during this encounter.  No follow-ups on file.  No future appointments.  Levie Heritage, DO Center for [employer], [facility]

## 2019-04-14 ENCOUNTER — Telehealth: Payer: Self-pay | Admitting: Emergency Medicine

## 2019-04-14 ENCOUNTER — Telehealth: Payer: Self-pay | Admitting: Advanced Practice Midwife

## 2019-04-14 NOTE — Telephone Encounter (Signed)
Called the patient to inform of the upcoming appointment, the patient verbalized understanding and stated she the [employer] webex app downloaded.

## 2019-04-14 NOTE — Telephone Encounter (Signed)
Called pt and informed her that her blood pressure cuff was shipped today and she should receive it within the next week. Pt was also instructed to take her blood pressure once a week and enter it into the babyscripts app and to call with abnormal readings. Pt verbalized understanding and had no further questions or concerns at this time.

## 2019-04-20 ENCOUNTER — Telehealth: Payer: Self-pay | Admitting: Obstetrics & Gynecology

## 2019-04-20 NOTE — Telephone Encounter (Signed)
Called the patient to confirm the appointment, the patient verbalized understanding. ° °Stated she has the app downloaded. °

## 2019-04-22 ENCOUNTER — Ambulatory Visit (INDEPENDENT_AMBULATORY_CARE_PROVIDER_SITE_OTHER): Payer: Commercial Managed Care - PPO | Admitting: Obstetrics & Gynecology

## 2019-04-22 ENCOUNTER — Other Ambulatory Visit: Payer: Self-pay

## 2019-04-22 DIAGNOSIS — Z3A39 39 weeks gestation of pregnancy: Secondary | ICD-10-CM

## 2019-04-22 DIAGNOSIS — O0990 Supervision of high risk pregnancy, unspecified, unspecified trimester: Secondary | ICD-10-CM

## 2019-04-22 DIAGNOSIS — O24419 Gestational diabetes mellitus in pregnancy, unspecified control: Secondary | ICD-10-CM

## 2019-04-22 NOTE — Progress Notes (Signed)
I connected with  Veronica Torres on 04/22/19 at  4:15 PM EDT by telephone and verified that I am speaking with the correct person using two identifiers.   I discussed the limitations, risks, security and privacy concerns of performing an evaluation and management service by telephone and the availability of in person appointments. I also discussed with the patient that there may be a patient responsible charge related to this service. The patient expressed understanding and agreed to proceed.  Ernestina Patches, CMA 04/22/2019  4:21 PM

## 2019-04-22 NOTE — Patient Instructions (Signed)

## 2019-04-23 ENCOUNTER — Telehealth (HOSPITAL_COMMUNITY): Payer: Self-pay | Admitting: *Deleted

## 2019-04-23 ENCOUNTER — Encounter (HOSPITAL_COMMUNITY): Payer: Self-pay | Admitting: *Deleted

## 2019-04-23 NOTE — Telephone Encounter (Signed)
Preadmission screen  

## 2019-04-23 NOTE — Progress Notes (Signed)
   TELEHEALTH VIRTUAL OBSTETRICS PRENATAL VISIT ENCOUNTER NOTE  I connected with Veronica Torres on 04/23/19 at  4:15 PM EDT by WebEx at home and verified that I am speaking with the correct person using two identifiers.   I discussed the limitations, risks, security and privacy concerns of performing an evaluation and management service by telephone and the availability of in person appointments. I also discussed with the patient that there may be a patient responsible charge related to this service. The patient expressed understanding and agreed to proceed. Subjective:  Veronica Torres is a 33 y.o. [id number] at [redacted]w[redacted]d being seen today for ongoing prenatal care.  She is currently monitored for the following issues for this high-risk pregnancy and has History of preterm delivery, currently pregnant; History of miscarriage; Supervision of high risk pregnancy, antepartum; Obesity in pregnancy; and Gestational diabetes on their problem list.  Patient reports no complaints.  Reports fetal movement. Contractions: Irritability. Vag. Bleeding: None.  Movement: Present. Denies any contractions, bleeding or leaking of fluid.   The following portions of the patient's history were reviewed and updated as appropriate: allergies, current medications, past family history, past medical history, past social history, past surgical history and problem list.   Objective:  There were no vitals filed for this visit.   Fetal Status:     Movement: Present     General:  Alert, oriented and cooperative. Patient is in no acute distress.  Respiratory: Normal respiratory effort, no problems with respiration noted  Mental Status: Normal mood and affect. Normal behavior. Normal judgment and thought content.  Rest of physical exam deferred due to type of encounter  Assessment and Plan:  Pregnancy: [id number] at [redacted]w[redacted]d 1. Gestational diabetes mellitus (GDM) in third trimester, diet control Patient ran out of strips, unable to check  CBGs. Tried to refill them but insurance did not cover them.  Has had great control of the GDM. She will try again soon. Will proceed with IOL at 40 weeks, this will be scheduled. Also, patient never had a growth scan at 38 weeks, this was ordered. - [place] MFM OB FOLLOW UP; Future  2. Supervision of high risk pregnancy, antepartum Term labor symptoms and general obstetric precautions including but not limited to vaginal bleeding, contractions, leaking of fluid and fetal movement were reviewed in detail with the patient. I discussed the assessment and treatment plan with the patient. The patient was provided an opportunity to ask questions and all were answered. The patient agreed with the plan and demonstrated an understanding of the instructions. The patient was advised to call back or seek an in-person office evaluation/go to MAU at [facility] for any urgent or concerning symptoms. Please refer to After Visit Summary for other counseling recommendations.   Return for IOL next week. Postpartum visit in about 5 weeks from now.  Future Appointments  Date Time Provider Department Center  04/24/2019  7:45 AM WH-MFC [place] 2 WH-MFCUS MFC-US  04/24/2019  7:50 AM WH-MFC NURSE WH-MFC MFC-US  04/27/2019  6:30 AM MC-LD SCHED ROOM MC-INDC None    Jaynie Collins, MD Center for [facility], [facility]

## 2019-04-24 ENCOUNTER — Ambulatory Visit (HOSPITAL_COMMUNITY): Payer: Commercial Managed Care - PPO

## 2019-04-24 ENCOUNTER — Other Ambulatory Visit: Payer: Self-pay

## 2019-04-24 ENCOUNTER — Encounter (HOSPITAL_COMMUNITY): Payer: Self-pay

## 2019-04-24 ENCOUNTER — Ambulatory Visit (HOSPITAL_BASED_OUTPATIENT_CLINIC_OR_DEPARTMENT_OTHER)
Admission: RE | Admit: 2019-04-24 | Discharge: 2019-04-24 | Disposition: A | Discharge status: Home / Self Care | Payer: Commercial Managed Care - PPO | Source: Ambulatory Visit | Attending: Obstetrics & Gynecology | Admitting: Obstetrics & Gynecology

## 2019-04-24 ENCOUNTER — Other Ambulatory Visit: Payer: Self-pay | Admitting: Family Medicine

## 2019-04-24 ENCOUNTER — Other Ambulatory Visit (HOSPITAL_COMMUNITY): Payer: Self-pay | Admitting: *Deleted

## 2019-04-24 ENCOUNTER — Ambulatory Visit (HOSPITAL_COMMUNITY): Payer: Commercial Managed Care - PPO | Admitting: *Deleted

## 2019-04-24 VITALS — BP 100/53 | HR 79 | Temp 98.6°F

## 2019-04-24 DIAGNOSIS — O24420 Gestational diabetes mellitus in childbirth, diet controlled: Secondary | ICD-10-CM | POA: Diagnosis not present

## 2019-04-24 DIAGNOSIS — O24419 Gestational diabetes mellitus in pregnancy, unspecified control: Secondary | ICD-10-CM | POA: Insufficient documentation

## 2019-04-24 DIAGNOSIS — O99213 Obesity complicating pregnancy, third trimester: Secondary | ICD-10-CM

## 2019-04-24 DIAGNOSIS — R600 Localized edema: Secondary | ICD-10-CM | POA: Diagnosis not present

## 2019-04-24 DIAGNOSIS — O09213 Supervision of pregnancy with history of pre-term labor, third trimester: Secondary | ICD-10-CM

## 2019-04-24 DIAGNOSIS — Z711 Person with feared health complaint in whom no diagnosis is made: Secondary | ICD-10-CM | POA: Diagnosis not present

## 2019-04-24 DIAGNOSIS — Z3A39 39 weeks gestation of pregnancy: Secondary | ICD-10-CM | POA: Diagnosis not present

## 2019-04-24 DIAGNOSIS — O0990 Supervision of high risk pregnancy, unspecified, unspecified trimester: Secondary | ICD-10-CM

## 2019-04-24 DIAGNOSIS — O24410 Gestational diabetes mellitus in pregnancy, diet controlled: Secondary | ICD-10-CM

## 2019-04-24 DIAGNOSIS — Z362 Encounter for other antenatal screening follow-up: Secondary | ICD-10-CM

## 2019-04-25 ENCOUNTER — Inpatient Hospital Stay (EMERGENCY_DEPARTMENT_HOSPITAL)
Admission: AD | Admit: 2019-04-25 | Discharge: 2019-04-25 | Disposition: A | Discharge status: Home / Self Care | Payer: Commercial Managed Care - PPO | Source: Home / Self Care | Attending: Obstetrics and Gynecology | Admitting: Obstetrics and Gynecology

## 2019-04-25 ENCOUNTER — Encounter (HOSPITAL_COMMUNITY): Payer: Self-pay

## 2019-04-25 ENCOUNTER — Other Ambulatory Visit: Payer: Self-pay

## 2019-04-25 DIAGNOSIS — Z3A39 39 weeks gestation of pregnancy: Secondary | ICD-10-CM

## 2019-04-25 DIAGNOSIS — R600 Localized edema: Secondary | ICD-10-CM

## 2019-04-25 DIAGNOSIS — O24419 Gestational diabetes mellitus in pregnancy, unspecified control: Secondary | ICD-10-CM | POA: Diagnosis not present

## 2019-04-25 DIAGNOSIS — Z711 Person with feared health complaint in whom no diagnosis is made: Secondary | ICD-10-CM

## 2019-04-25 DIAGNOSIS — O24429 Gestational diabetes mellitus in childbirth, unspecified control: Secondary | ICD-10-CM | POA: Insufficient documentation

## 2019-04-25 DIAGNOSIS — Z1159 Encounter for screening for other viral diseases: Secondary | ICD-10-CM | POA: Insufficient documentation

## 2019-04-25 DIAGNOSIS — O1203 Gestational edema, third trimester: Secondary | ICD-10-CM | POA: Insufficient documentation

## 2019-04-25 LAB — URINALYSIS, ROUTINE W REFLEX MICROSCOPIC
Bilirubin Urine: NEGATIVE
Glucose, UA: NEGATIVE mg/dL
Hgb urine dipstick: NEGATIVE
Ketones, ur: NEGATIVE mg/dL
Leukocytes,Ua: NEGATIVE
Nitrite: NEGATIVE
Protein, ur: NEGATIVE mg/dL
Specific Gravity, Urine: 1.005 (ref 1.005–1.030)
pH: 7 (ref 5.0–8.0)

## 2019-04-25 LAB — GLUCOSE, CAPILLARY: Glucose-Capillary: 97 mg/dL (ref 70–99)

## 2019-04-25 NOTE — MAU Provider Note (Signed)
History     CSN: [id number]  Arrival date and time: 04/25/19 2157   First Provider Initiated Contact with Patient 04/25/19 2244      Chief Complaint  Patient presents with  . Leg Swelling   HPI   [name] is a 33 y.o. female [phone] @ 9w3dwith gestational DM, here with floaters. Says she has had this throughout the pregnancy. What brought her into the ER tonight was the onset of swelling in the feet and hands. She was not outside today. Says she has drank 5-6 bottles of water today. No floaters currently.   OB History    Gravida  4   Para  2   Term  1   Preterm  1   AB  1   Living  2     SAB  1   TAB      Ectopic      Multiple  0   Live Births  2           Past Medical History:  Diagnosis Date  . Gestational diabetes   . Medical history non-contributory     Past Surgical History:  Procedure Laterality Date  . TOOTH EXTRACTION      History reviewed. No pertinent family history.  Social History   Tobacco Use  . Smoking status: Never Smoker  . Smokeless tobacco: Never Used  Substance Use Topics  . Alcohol use: No  . Drug use: No    Allergies: No Known Allergies  Medications Prior to Admission  Medication Sig Dispense Refill Last Dose  . Accu-Chek FastClix Lancets MISC 1 Units by Percutaneous route 4 (four) times daily. 100 each 12 Taking  . Blood Glucose Monitoring Suppl (ONETOUCH VERIO IQ SYSTEM) w/Device KIT 1 Device by Does not apply route 4 (four) times daily as needed. 1 kit 0 Taking  . glucose blood test strip Use as instructed 100 each 12 Taking  . Prenatal Vit-Fe Phos-FA-Omega (VITAFOL GUMMIES) 3.33-0.333-34.8 MG CHEW Chew 3 each by mouth daily. 90 tablet 11 Taking   Results for orders placed or performed during the hospital encounter of 04/25/19 (from the past 48 hour(s))  Urinalysis, Routine w reflex microscopic     Status: Abnormal   Collection Time: 04/25/19 10:24 PM  Result Value Ref Range   Color, Urine STRAW (A)  YELLOW   APPearance CLEAR CLEAR   Specific Gravity, Urine 1.005 1.005 - 1.030   pH 7.0 5.0 - 8.0   Glucose, UA NEGATIVE NEGATIVE mg/dL   Hgb urine dipstick NEGATIVE NEGATIVE   Bilirubin Urine NEGATIVE NEGATIVE   Ketones, ur NEGATIVE NEGATIVE mg/dL   Protein, ur NEGATIVE NEGATIVE mg/dL   Nitrite NEGATIVE NEGATIVE   Leukocytes,Ua NEGATIVE NEGATIVE    Comment: Performed at [id number] [place] [id number] Glucose, capillary     Status: None   Collection Time: 04/25/19 10:55 PM  Result Value Ref Range   Glucose-Capillary 97 70 - 99 mg/dL   Review of Systems  Eyes: Negative for visual disturbance.  Gastrointestinal: Negative for abdominal pain.  Genitourinary: Negative for vaginal bleeding.  Neurological: Negative for headaches.   Physical Exam   Blood pressure 117/72, pulse 91, temperature 98.4 F (36.9 C), resp. rate 16, height 5' 4" (1.626 m), weight 103.4 kg, last menstrual period 07/23/2018, unknown if currently breastfeeding.   Patient Vitals for the past 24 hrs:  BP Temp Pulse Resp Height Weight  04/25/19 2231 117/72 - 91 16 - -  04/25/19 2216 125/63 98.4 F (36.9 C) 87 - 5' 4" (1.626 m) 103.4 kg    Physical Exam  Constitutional: She is oriented to person, place, and time. She appears well-developed and well-nourished. She appears distressed.  HENT:  Head: Normocephalic.  Respiratory: Effort normal and breath sounds normal. No respiratory distress. She has no wheezes.  GI: Soft.  Musculoskeletal:        General: Edema (+1 non-pitting edema in bilateral lower extremities) present.  Neurological: She is alert and oriented to person, place, and time. She has normal reflexes. She displays normal reflexes.  Skin: Skin is warm. She is not diaphoretic.    MAU Course  Procedures  None  MDM  Well appearing Edema minimal Reactive NST. 15x15 accels no decels   Induction scheduled BS WNL  Assessment and Plan   A:  1. Fluid collection  (edema) in the arms, legs, hands and feet   2. Gestational diabetes mellitus (GDM) in third trimester, gestational diabetes method of control unspecified   3. [redacted] weeks gestation of pregnancy   4. Physically well but worried     P:  Discharge home in stable condition Return to MAU if symptoms worsen Arrive on Monday for induction Fetal kick counts   Rasch, Jennifer I, NP 04/27/2019 8:55 AM  

## 2019-04-25 NOTE — MAU Note (Signed)
Pt reports she started having swelling in her hands and feet and tingling in her hands. C/o seeing floaters and headache on and off. Non headache now. GDM. Good fetal movement reported.

## 2019-04-25 NOTE — Discharge Instructions (Signed)

## 2019-04-27 ENCOUNTER — Encounter (HOSPITAL_COMMUNITY): Payer: Self-pay | Admitting: *Deleted

## 2019-04-27 ENCOUNTER — Encounter (HOSPITAL_COMMUNITY)
Admission: AD | Disposition: A | Discharge status: Home / Self Care | Payer: Self-pay | Source: Home / Self Care | Attending: Obstetrics & Gynecology

## 2019-04-27 ENCOUNTER — Inpatient Hospital Stay (HOSPITAL_COMMUNITY)
Admission: AD | Admit: 2019-04-27 | Discharge: 2019-04-28 | DRG: 788 | Disposition: A | Discharge status: Home / Self Care | Payer: Commercial Managed Care - PPO | Attending: Obstetrics & Gynecology | Admitting: Obstetrics & Gynecology

## 2019-04-27 ENCOUNTER — Inpatient Hospital Stay (HOSPITAL_COMMUNITY): Payer: Commercial Managed Care - PPO | Admitting: Certified Registered Nurse Anesthetist

## 2019-04-27 ENCOUNTER — Inpatient Hospital Stay (HOSPITAL_COMMUNITY): Payer: Commercial Managed Care - PPO

## 2019-04-27 ENCOUNTER — Other Ambulatory Visit: Payer: Self-pay

## 2019-04-27 DIAGNOSIS — O24420 Gestational diabetes mellitus in childbirth, diet controlled: Principal | ICD-10-CM | POA: Diagnosis present

## 2019-04-27 DIAGNOSIS — O24419 Gestational diabetes mellitus in pregnancy, unspecified control: Secondary | ICD-10-CM | POA: Diagnosis present

## 2019-04-27 DIAGNOSIS — Z1159 Encounter for screening for other viral diseases: Secondary | ICD-10-CM | POA: Diagnosis not present

## 2019-04-27 DIAGNOSIS — O99210 Obesity complicating pregnancy, unspecified trimester: Secondary | ICD-10-CM | POA: Diagnosis present

## 2019-04-27 DIAGNOSIS — Z3A39 39 weeks gestation of pregnancy: Secondary | ICD-10-CM

## 2019-04-27 DIAGNOSIS — O99214 Obesity complicating childbirth: Secondary | ICD-10-CM | POA: Diagnosis present

## 2019-04-27 DIAGNOSIS — O09899 Supervision of other high risk pregnancies, unspecified trimester: Secondary | ICD-10-CM

## 2019-04-27 DIAGNOSIS — Z3490 Encounter for supervision of normal pregnancy, unspecified, unspecified trimester: Secondary | ICD-10-CM | POA: Diagnosis present

## 2019-04-27 LAB — NOVEL CORONAVIRUS, NAA (HOSP ORDER, SEND-OUT TO REF LAB; TAT 18-24 HRS): SARS-CoV-2, NAA: NOT DETECTED

## 2019-04-27 LAB — COMPREHENSIVE METABOLIC PANEL
ALT: 13 U/L (ref 0–44)
AST: 22 U/L (ref 15–41)
Albumin: 2.9 g/dL — ABNORMAL LOW (ref 3.5–5.0)
Alkaline Phosphatase: 102 U/L (ref 38–126)
Anion gap: 8 (ref 5–15)
BUN: <5 mg/dL — ABNORMAL LOW (ref 6–20)
CO2: 19 mmol/L — ABNORMAL LOW (ref 22–32)
Calcium: 8.7 mg/dL — ABNORMAL LOW (ref 8.9–10.3)
Chloride: 108 mmol/L (ref 98–111)
Creatinine, Ser: 0.85 mg/dL (ref 0.44–1.00)
GFR calc Af Amer: >60 mL/min (ref >60)
GFR calc non Af Amer: >60 mL/min (ref >60)
Glucose, Bld: 88 mg/dL (ref 70–99)
Potassium: 4.1 mmol/L (ref 3.5–5.1)
Sodium: 135 mmol/L (ref 135–145)
Total Bilirubin: 0.5 mg/dL (ref 0.3–1.2)
Total Protein: 6.4 g/dL — ABNORMAL LOW (ref 6.5–8.1)

## 2019-04-27 LAB — CBC
HCT: 34 % — ABNORMAL LOW (ref 36.0–46.0)
Hemoglobin: 10.5 g/dL — ABNORMAL LOW (ref 12.0–15.0)
MCH: 25 pg — ABNORMAL LOW (ref 26.0–34.0)
MCHC: 30.9 g/dL (ref 30.0–36.0)
MCV: 81 fL (ref 80.0–100.0)
Platelets: 205 10*3/uL (ref 150–400)
RBC: 4.2 MIL/uL (ref 3.87–5.11)
RDW: 14.6 % (ref 11.5–15.5)
WBC: 6 10*3/uL (ref 4.0–10.5)
nRBC: 0 % (ref 0.0–0.2)

## 2019-04-27 LAB — GLUCOSE, CAPILLARY: Glucose-Capillary: 85 mg/dL (ref 70–99)

## 2019-04-27 LAB — TYPE AND SCREEN
ABO/RH(D): A POS
Antibody Screen: NEGATIVE

## 2019-04-27 LAB — ABO/RH: ABO/RH(D): A POS

## 2019-04-27 LAB — RPR: RPR Ser Ql: NONREACTIVE

## 2019-04-27 SURGERY — Surgical Case
Anesthesia: Spinal

## 2019-04-27 MED ORDER — LACTATED RINGERS IV SOLN
INTRAVENOUS | Status: DC
  Administered 2019-04-27: 08:00:00 via INTRAVENOUS

## 2019-04-27 MED ORDER — MISOPROSTOL 25 MCG QUARTER TABLET: 25.0000 ug | ORAL_TABLET | ORAL | Status: DC | PRN

## 2019-04-27 MED ORDER — LACTATED RINGERS IV SOLN: 500.0000 mL | INTRAVENOUS | Status: DC | PRN

## 2019-04-27 MED ORDER — OXYTOCIN 40 UNITS IN NORMAL SALINE INFUSION - SIMPLE MED
INTRAVENOUS | Status: AC
  Filled 2019-04-27: qty 1000

## 2019-04-27 MED ORDER — MORPHINE SULFATE (PF) 4 MG/ML IV SOLN: 1.0000 mg | INTRAVENOUS | Status: DC | PRN

## 2019-04-27 MED ORDER — MORPHINE SULFATE (PF) 0.5 MG/ML IJ SOLN
INTRAMUSCULAR | Status: AC
  Filled 2019-04-27: qty 10

## 2019-04-27 MED ORDER — KETOROLAC TROMETHAMINE 30 MG/ML IJ SOLN
30.0000 mg | Freq: Once | INTRAMUSCULAR | Status: AC | PRN
  Administered 2019-04-27: 11:00:00 30 mg via INTRAVENOUS

## 2019-04-27 MED ORDER — LACTATED RINGERS IV SOLN
INTRAVENOUS | Status: DC
  Administered 2019-04-27: 16:00:00 via INTRAVENOUS

## 2019-04-27 MED ORDER — FENTANYL CITRATE (PF) 100 MCG/2ML IJ SOLN
INTRAMUSCULAR | Status: DC | PRN
  Administered 2019-04-27: 15 ug via INTRATHECAL

## 2019-04-27 MED ORDER — ENOXAPARIN SODIUM 60 MG/0.6ML ~~LOC~~ SOLN
50.0000 mg | SUBCUTANEOUS | Status: DC
  Administered 2019-04-28: 08:00:00 50 mg via SUBCUTANEOUS
  Filled 2019-04-27: qty 0.6

## 2019-04-27 MED ORDER — FERROUS SULFATE 325 (65 FE) MG PO TABS
325.0000 mg | ORAL_TABLET | Freq: Two times a day (BID) | ORAL | Status: DC
  Administered 2019-04-27 – 2019-04-28 (×2): 325 mg via ORAL
  Filled 2019-04-27 (×2): qty 1

## 2019-04-27 MED ORDER — OXYTOCIN 40 UNITS IN NORMAL SALINE INFUSION - SIMPLE MED: 2.5000 [IU]/h | INTRAVENOUS | Status: AC

## 2019-04-27 MED ORDER — MORPHINE SULFATE (PF) 0.5 MG/ML IJ SOLN
INTRAMUSCULAR | Status: DC | PRN
  Administered 2019-04-27: .15 mg via INTRATHECAL

## 2019-04-27 MED ORDER — PROMETHAZINE HCL 25 MG/ML IJ SOLN: 6.2500 mg | INTRAMUSCULAR | Status: DC | PRN

## 2019-04-27 MED ORDER — ZOLPIDEM TARTRATE 5 MG PO TABS: 5.0000 mg | ORAL_TABLET | Freq: Every evening | ORAL | Status: DC | PRN

## 2019-04-27 MED ORDER — LACTATED RINGERS IV SOLN
500.0000 mL | Freq: Once | INTRAVENOUS | Status: AC
  Administered 2019-04-27: 500 mL via INTRAVENOUS

## 2019-04-27 MED ORDER — WITCH HAZEL-GLYCERIN EX PADS: 1.0000 "application " | MEDICATED_PAD | CUTANEOUS | Status: DC | PRN

## 2019-04-27 MED ORDER — DIPHENHYDRAMINE HCL 25 MG PO CAPS: 25.0000 mg | ORAL_CAPSULE | Freq: Four times a day (QID) | ORAL | Status: DC | PRN

## 2019-04-27 MED ORDER — KETOROLAC TROMETHAMINE 30 MG/ML IJ SOLN
INTRAMUSCULAR | Status: AC
  Filled 2019-04-27: qty 1

## 2019-04-27 MED ORDER — LIDOCAINE HCL (PF) 1 % IJ SOLN: 30.0000 mL | INTRAMUSCULAR | Status: DC | PRN

## 2019-04-27 MED ORDER — ONDANSETRON HCL 4 MG/2ML IJ SOLN
INTRAMUSCULAR | Status: DC | PRN
  Administered 2019-04-27: 4 mg via INTRAVENOUS

## 2019-04-27 MED ORDER — CEFAZOLIN SODIUM-DEXTROSE 2-4 GM/100ML-% IV SOLN: 2.0000 g | Freq: Once | INTRAVENOUS | Status: DC

## 2019-04-27 MED ORDER — PHENYLEPHRINE 40 MCG/ML (10ML) SYRINGE FOR IV PUSH (FOR BLOOD PRESSURE SUPPORT): 80.0000 ug | PREFILLED_SYRINGE | INTRAVENOUS | Status: DC | PRN

## 2019-04-27 MED ORDER — DIBUCAINE (PERIANAL) 1 % EX OINT: 1.0000 "application " | TOPICAL_OINTMENT | CUTANEOUS | Status: DC | PRN

## 2019-04-27 MED ORDER — PHENYLEPHRINE HCL-NACL 20-0.9 MG/250ML-% IV SOLN
INTRAVENOUS | Status: AC
  Filled 2019-04-27: qty 250

## 2019-04-27 MED ORDER — SOD CITRATE-CITRIC ACID 500-334 MG/5ML PO SOLN
30.0000 mL | ORAL | Status: DC | PRN
  Administered 2019-04-27: 09:00:00 30 mL via ORAL
  Filled 2019-04-27: qty 30

## 2019-04-27 MED ORDER — EPHEDRINE 5 MG/ML INJ: 10.0000 mg | INTRAVENOUS | Status: DC | PRN

## 2019-04-27 MED ORDER — MIDAZOLAM HCL 2 MG/2ML IJ SOLN: 0.5000 mg | Freq: Once | INTRAMUSCULAR | Status: DC | PRN

## 2019-04-27 MED ORDER — OXYTOCIN 40 UNITS IN NORMAL SALINE INFUSION - SIMPLE MED: 2.5000 [IU]/h | INTRAVENOUS | Status: DC

## 2019-04-27 MED ORDER — OXYCODONE HCL 5 MG PO TABS: 5.0000 mg | ORAL_TABLET | ORAL | Status: DC | PRN

## 2019-04-27 MED ORDER — MENTHOL 3 MG MT LOZG: 1.0000 | LOZENGE | OROMUCOSAL | Status: DC | PRN

## 2019-04-27 MED ORDER — PRENATAL MULTIVITAMIN CH
1.0000 | ORAL_TABLET | Freq: Every day | ORAL | Status: DC
  Administered 2019-04-28: 10:00:00 1 via ORAL
  Filled 2019-04-27: qty 1

## 2019-04-27 MED ORDER — OXYCODONE-ACETAMINOPHEN 5-325 MG PO TABS: 2.0000 | ORAL_TABLET | ORAL | Status: DC | PRN

## 2019-04-27 MED ORDER — NALBUPHINE HCL 10 MG/ML IJ SOLN: 5.0000 mg | INTRAMUSCULAR | Status: DC | PRN

## 2019-04-27 MED ORDER — KETOROLAC TROMETHAMINE 30 MG/ML IJ SOLN
30.0000 mg | Freq: Four times a day (QID) | INTRAMUSCULAR | Status: AC
  Administered 2019-04-27 – 2019-04-28 (×3): 30 mg via INTRAVENOUS
  Filled 2019-04-27 (×3): qty 1

## 2019-04-27 MED ORDER — HYDROXYZINE HCL 50 MG PO TABS: 50.0000 mg | ORAL_TABLET | Freq: Four times a day (QID) | ORAL | Status: DC | PRN

## 2019-04-27 MED ORDER — DOCUSATE SODIUM 100 MG PO CAPS
100.0000 mg | ORAL_CAPSULE | Freq: Two times a day (BID) | ORAL | Status: DC
  Administered 2019-04-27 – 2019-04-28 (×2): 100 mg via ORAL
  Filled 2019-04-27 (×2): qty 1

## 2019-04-27 MED ORDER — OXYTOCIN BOLUS FROM INFUSION: 500.0000 mL | Freq: Once | INTRAVENOUS | Status: DC

## 2019-04-27 MED ORDER — TERBUTALINE SULFATE 1 MG/ML IJ SOLN: 0.2500 mg | Freq: Once | INTRAMUSCULAR | Status: DC | PRN

## 2019-04-27 MED ORDER — SIMETHICONE 80 MG PO CHEW
80.0000 mg | CHEWABLE_TABLET | Freq: Three times a day (TID) | ORAL | Status: DC
  Administered 2019-04-27 – 2019-04-28 (×2): 80 mg via ORAL
  Filled 2019-04-27 (×2): qty 1

## 2019-04-27 MED ORDER — OXYCODONE-ACETAMINOPHEN 5-325 MG PO TABS: 1.0000 | ORAL_TABLET | ORAL | Status: DC | PRN

## 2019-04-27 MED ORDER — MISOPROSTOL 50MCG HALF TABLET
50.0000 ug | ORAL_TABLET | ORAL | Status: DC
  Filled 2019-04-27: qty 1

## 2019-04-27 MED ORDER — SIMETHICONE 80 MG PO CHEW: 80.0000 mg | CHEWABLE_TABLET | ORAL | Status: DC | PRN

## 2019-04-27 MED ORDER — IBUPROFEN 800 MG PO TABS: 800.0000 mg | ORAL_TABLET | Freq: Three times a day (TID) | ORAL | Status: DC

## 2019-04-27 MED ORDER — PHENYLEPHRINE HCL-NACL 20-0.9 MG/250ML-% IV SOLN
INTRAVENOUS | Status: DC | PRN
  Administered 2019-04-27: 60 ug/min via INTRAVENOUS

## 2019-04-27 MED ORDER — BUPIVACAINE IN DEXTROSE 0.75-8.25 % IT SOLN
INTRATHECAL | Status: DC | PRN
  Administered 2019-04-27: 1.6 mL via INTRATHECAL

## 2019-04-27 MED ORDER — SIMETHICONE 80 MG PO CHEW
80.0000 mg | CHEWABLE_TABLET | ORAL | Status: DC
  Administered 2019-04-27: 23:00:00 80 mg via ORAL
  Filled 2019-04-27: qty 1

## 2019-04-27 MED ORDER — DIPHENHYDRAMINE HCL 50 MG/ML IJ SOLN: 12.5000 mg | INTRAMUSCULAR | Status: DC | PRN

## 2019-04-27 MED ORDER — FENTANYL CITRATE (PF) 100 MCG/2ML IJ SOLN
INTRAMUSCULAR | Status: AC
  Filled 2019-04-27: qty 2

## 2019-04-27 MED ORDER — DEXAMETHASONE SODIUM PHOSPHATE 4 MG/ML IJ SOLN
INTRAMUSCULAR | Status: AC
  Filled 2019-04-27: qty 1

## 2019-04-27 MED ORDER — MISOPROSTOL 50MCG HALF TABLET
50.0000 ug | ORAL_TABLET | ORAL | Status: DC
  Administered 2019-04-27: 08:00:00 50 ug via BUCCAL

## 2019-04-27 MED ORDER — ONDANSETRON HCL 4 MG/2ML IJ SOLN
INTRAMUSCULAR | Status: AC
  Filled 2019-04-27: qty 2

## 2019-04-27 MED ORDER — OXYTOCIN 40 UNITS IN NORMAL SALINE INFUSION - SIMPLE MED
INTRAVENOUS | Status: DC | PRN
  Administered 2019-04-27 (×5): 100 mL via INTRAVENOUS

## 2019-04-27 MED ORDER — COCONUT OIL OIL
1.0000 "application " | TOPICAL_OIL | Status: DC | PRN
  Administered 2019-04-27: 1 via TOPICAL

## 2019-04-27 MED ORDER — FENTANYL-BUPIVACAINE-NACL 0.5-0.125-0.9 MG/250ML-% EP SOLN: 12.0000 mL/h | EPIDURAL | Status: DC | PRN

## 2019-04-27 MED ORDER — ONDANSETRON HCL 4 MG/2ML IJ SOLN: 4.0000 mg | Freq: Four times a day (QID) | INTRAMUSCULAR | Status: DC | PRN

## 2019-04-27 MED ORDER — FENTANYL CITRATE (PF) 100 MCG/2ML IJ SOLN
50.0000 ug | INTRAMUSCULAR | Status: DC | PRN
  Administered 2019-04-27: 12:00:00 50 ug via INTRAVENOUS

## 2019-04-27 MED ORDER — MEPERIDINE HCL 25 MG/ML IJ SOLN: 6.2500 mg | INTRAMUSCULAR | Status: DC | PRN

## 2019-04-27 MED ORDER — FLEET ENEMA 7-19 GM/118ML RE ENEM: 1.0000 | ENEMA | Freq: Every day | RECTAL | Status: DC | PRN

## 2019-04-27 MED ORDER — CEFAZOLIN SODIUM-DEXTROSE 2-3 GM-%(50ML) IV SOLR
INTRAVENOUS | Status: DC | PRN
  Administered 2019-04-27: 2 g via INTRAVENOUS

## 2019-04-27 MED ORDER — ACETAMINOPHEN 325 MG PO TABS: 650.0000 mg | ORAL_TABLET | ORAL | Status: DC | PRN

## 2019-04-27 MED ORDER — ACETAMINOPHEN 325 MG PO TABS
650.0000 mg | ORAL_TABLET | ORAL | Status: DC | PRN
  Administered 2019-04-28: 10:00:00 650 mg via ORAL
  Filled 2019-04-27: qty 2

## 2019-04-27 MED ORDER — DEXAMETHASONE SODIUM PHOSPHATE 4 MG/ML IJ SOLN
INTRAMUSCULAR | Status: DC | PRN
  Administered 2019-04-27: 4 mg via INTRAVENOUS

## 2019-04-27 MED ORDER — OXYTOCIN 40 UNITS IN NORMAL SALINE INFUSION - SIMPLE MED: 1.0000 m[IU]/min | INTRAVENOUS | Status: DC

## 2019-04-27 MED ORDER — PROMETHAZINE HCL 25 MG/ML IJ SOLN
INTRAMUSCULAR | Status: AC
  Filled 2019-04-27: qty 1

## 2019-04-27 SURGICAL SUPPLY — 32 items
BARRIER ADHS 3X4 INTERCEED (GAUZE/BANDAGES/DRESSINGS) IMPLANT
BENZOIN TINCTURE PRP APPL 2/3 (GAUZE/BANDAGES/DRESSINGS) ×2 IMPLANT
CHLORAPREP W/TINT 26ML (MISCELLANEOUS) ×2 IMPLANT
CLAMP CORD UMBIL (MISCELLANEOUS) IMPLANT
CLOTH BEACON ORANGE TIMEOUT ST (SAFETY) ×2 IMPLANT
DRSG OPSITE POSTOP 4X10 (GAUZE/BANDAGES/DRESSINGS) ×2 IMPLANT
ELECT REM PT RETURN 9FT ADLT (ELECTROSURGICAL) ×2
ELECTRODE REM PT RTRN 9FT ADLT (ELECTROSURGICAL) ×1 IMPLANT
EXTRACTOR VACUUM KIWI (MISCELLANEOUS) IMPLANT
GLOVE BIO SURGEON STRL SZ 6.5 (GLOVE) ×2 IMPLANT
GLOVE BIOGEL PI IND STRL 7.0 (GLOVE) ×2 IMPLANT
GLOVE BIOGEL PI INDICATOR 7.0 (GLOVE) ×2
GOWN STRL REUS W/TWL LRG LVL3 (GOWN DISPOSABLE) ×4 IMPLANT
KIT ABG SYR 3ML LUER SLIP (SYRINGE) IMPLANT
NEEDLE HYPO 22GX1.5 SAFETY (NEEDLE) IMPLANT
NEEDLE HYPO 25X5/8 SAFETYGLIDE (NEEDLE) IMPLANT
NS IRRIG 1000ML POUR BTL (IV SOLUTION) ×2 IMPLANT
PACK C SECTION WH (CUSTOM PROCEDURE TRAY) ×2 IMPLANT
PAD OB MATERNITY 4.3X12.25 (PERSONAL CARE ITEMS) ×2 IMPLANT
PENCIL SMOKE EVAC W/HOLSTER (ELECTROSURGICAL) ×2 IMPLANT
RETRACTOR WND ALEXIS 25 LRG (MISCELLANEOUS) IMPLANT
RTRCTR WOUND ALEXIS 25CM LRG (MISCELLANEOUS)
STRIP CLOSURE SKIN 1/2X4 (GAUZE/BANDAGES/DRESSINGS) ×2 IMPLANT
SUT VIC AB 0 CT1 36 (SUTURE) ×12 IMPLANT
SUT VIC AB 2-0 CT1 27 (SUTURE) ×1
SUT VIC AB 2-0 CT1 TAPERPNT 27 (SUTURE) ×1 IMPLANT
SUT VIC AB 4-0 KS 27 (SUTURE) ×2 IMPLANT
SUT VIC AB 4-0 PS2 27 (SUTURE) ×2 IMPLANT
SYR CONTROL 10ML LL (SYRINGE) IMPLANT
TOWEL OR 17X24 6PK STRL BLUE (TOWEL DISPOSABLE) ×2 IMPLANT
TRAY FOLEY W/BAG SLVR 14FR LF (SET/KITS/TRAYS/PACK) IMPLANT
WATER STERILE IRR 1000ML POUR (IV SOLUTION) ×2 IMPLANT

## 2019-04-27 NOTE — Anesthesia Procedure Notes (Signed)
Spinal  Patient location during procedure: OR End time: 04/27/2019 9:26 AM Staffing Anesthesiologist: Jairo Ben, MD Performed: anesthesiologist  Preanesthetic Checklist Completed: patient identified, surgical consent, pre-op evaluation, timeout performed, IV checked, risks and benefits discussed and monitors and equipment checked Spinal Block Patient position: sitting Prep: site prepped and draped and DuraPrep Patient monitoring: blood pressure, continuous pulse ox, heart rate and cardiac monitor Location: L2-3 Injection technique: single-shot Needle Needle type: Pencan  Needle gauge: 24 G Needle length: 9 cm Assessment Sensory level: T4 Additional Notes Pt identified in Operating room.  Monitors applied. Working IV access confirmed. Sterile prep, drape lumbar spine.  1% lido local L 3,4.  #24ga pencan into clear CSF L 3,4.  12mg  0.75% Bupivacaine with dextrose, fentanyl, morphine injected with asp CSF beginning and end of injection.  Patient asymptomatic, VSS, no heme aspirated, tolerated well.  Sandford Craze, MD

## 2019-04-27 NOTE — Op Note (Signed)
Veronica Torres PROCEDURE DATE: 04/27/2019  PREOPERATIVE DIAGNOSES: Intrauterine pregnancy at [redacted]w[redacted]d weeks gestation; Non-reassuring fetal heart tones - fetal bradycardia  POSTOPERATIVE DIAGNOSES: The same  occiput posterior, thick meconium fluid  PROCEDURE: Primary Low Transverse Cesarean Section  SURGEON:  Dr. Scheryl Darter ASSISTANT:  Dr. Rhett Bannister   ANESTHESIOLOGY TEAM: Anesthesiologist: Jairo Ben, MD CRNA: Cleda Clarks, CRNA  INDICATIONS: Veronica Torres is a 33 y.o. [phone] at [redacted]w[redacted]d here for cesarean section secondary to the indications listed under preoperative diagnoses; please see preoperative note for further details.  The risks of cesarean section were discussed with the patient including but were not limited to: bleeding which may require transfusion or reoperation; infection which may require antibiotics; injury to bowel, bladder, ureters or other surrounding organs; injury to the fetus; need for additional procedures including hysterectomy in the event of a life-threatening hemorrhage; placental abnormalities wth subsequent pregnancies, incisional problems, thromboembolic phenomenon and other postoperative/anesthesia complications.   The patient concurred with the proposed plan, giving informed written consent for the procedure.    FINDINGS:  Viable female infant in direct OP presentation.  Apgars 5 and 8.  Thick meconium amniotic fluid.  Intact placenta, three vessel cord.  Normal uterus, fallopian tubes and ovaries bilaterally.  ANESTHESIA: Spinal INTRAVENOUS FLUIDS: 1500 ml   ESTIMATED BLOOD LOSS: 500 ml URINE OUTPUT:  200 ml SPECIMENS: Placenta sent to L&D COMPLICATIONS: None immediate  PROCEDURE IN DETAIL:  Patient with induction that started this morning with FB and cytotec. Soon after receiving cytotec, started to have recurrent and prolonged fetal decelerations to 60s. FB removed, intrauterine resuscitation attempted without improvement so decision made to  proceed with urgent primary Cesarean section. The patient preoperatively received intravenous antibiotics and had sequential compression devices applied to her lower extremities.  She was then taken to the operating room where spinal anesthesia was administered and was found to be adequate. She was then placed in a dorsal supine position with a leftward tilt, and prepped and draped in a sterile manner.  A foley catheter was placed into her bladder and attached to constant gravity.  After an adequate timeout was performed, a Pfannenstiel skin incision was made with scalpel and carried through to the underlying layer of fascia. The fascia was incised in the midline, and this incision was extended bilaterally using the Mayo scissors.  Kocher clamps were applied to the superior aspect of the fascial incision and the underlying rectus muscles were dissected off bluntly.  A similar process was carried out on the inferior aspect of the fascial incision. The rectus muscles were separated in the midline and the peritoneum was entered bluntly. The Alexis self-retaining retractor was introduced into the abdominal cavity.  Attention was turned to the lower uterine segment where a low transverse hysterotomy was made with a scalpel and extended bilaterally bluntly.  The infant was successfully delivered, the cord was clamped and cut after one minute, and the infant was handed over to the awaiting neonatology team. Uterine massage was then administered, and the placenta delivered intact with a three-vessel cord. The uterus was then cleared of clots and debris.  The hysterotomy was closed with 0 Vicryl in a running locked fashion, and an imbricating layer was also placed with 0 Vicryl.   The pelvis was cleared of all clot and debris. Hemostasis was confirmed on all surfaces.  The retractor was removed.  The peritoneum was closed with a 0 Vicryl running stitch. The fascia was then closed using 0 Vicryl in a running  fashion.  The  subcutaneous layer was irrigated, and the skin was closed with a 4-0 Vicryl subcuticular stitch. The patient tolerated the procedure well. Sponge, instrument and needle counts were correct x 3.  She was taken to the recovery room in stable condition.   Cristal DeerLaurel S. Earlene PlaterWallace, DO OB/GYN Fellow

## 2019-04-27 NOTE — Anesthesia Postprocedure Evaluation (Signed)
Anesthesia Post Note  Patient: Tailynn Syverson  Procedure(s) Performed: CESAREAN SECTION (N/A )     Patient location during evaluation: PACU Anesthesia Type: Spinal Level of consciousness: awake and alert, patient cooperative and oriented Pain management: pain level controlled Vital Signs Assessment: post-procedure vital signs reviewed and stable Respiratory status: spontaneous breathing, nonlabored ventilation and respiratory function stable Cardiovascular status: blood pressure returned to baseline and stable Postop Assessment: spinal receding, patient able to bend at knees and no apparent nausea or vomiting Anesthetic complications: no    Last Vitals:  Vitals:   04/27/19 1037 04/27/19 1045  BP:  (!) 126/112  Pulse: 69 73  Resp: (!) 21 14  Temp:    SpO2: 100% 99%    Last Pain:  Vitals:   04/27/19 1045  TempSrc:   PainSc: 0-No pain   Pain Goal: Patients Stated Pain Goal: 8 (04/27/19 0710)              Epidural/Spinal Function Cutaneous sensation: No Sensation (04/27/19 1045), Patient able to flex knees: No (04/27/19 1045), Patient able to lift hips off bed: No (04/27/19 1045), Back pain beyond tenderness at insertion site: No (04/27/19 1045), Progressively worsening motor and/or sensory loss: No (04/27/19 1045), Bowel and/or bladder incontinence post epidural: No (04/27/19 1045)  Koltan Portocarrero,E. Sherral Dirocco

## 2019-04-27 NOTE — Transfer of Care (Signed)
Immediate Anesthesia Transfer of Care Note  Patient: Veronica Torres  Procedure(s) Performed: CESAREAN SECTION (N/A )  Patient Location: PACU  Anesthesia Type:Spinal  Level of Consciousness: awake, alert  and oriented  Airway & Oxygen Therapy: Patient Spontanous Breathing  Post-op Assessment: Report given to RN and Post -op Vital signs reviewed and stable  Post vital signs: Reviewed and stable  Last Vitals:  Vitals Value Taken Time  BP    Temp    Pulse 68 04/27/2019 10:22 AM  Resp 15 04/27/2019 10:22 AM  SpO2 97 % 04/27/2019 10:22 AM  Vitals shown include unvalidated device data.  Last Pain:  Vitals:   04/27/19 0845  TempSrc:   PainSc: 8       Patients Stated Pain Goal: 8 (04/27/19 0710)  Complications: No apparent anesthesia complications

## 2019-04-27 NOTE — Progress Notes (Addendum)
Subjective: Veronica Torres is a 33 y.o. [id number] at [redacted]w[redacted]d by LMP admitted for induction of labor due to Gestational diabetes. Called to room for non-reassuring FHR tracing. Prolonged decels 1 hour after placement of FB and cytotec.   Objective: BP 124/72   Pulse 75   Temp 98.3 F (36.8 C) (Oral)   Resp 16   Ht 5\' 4"  (1.626 m)   Wt 102.1 kg   LMP 07/23/2018 (Exact Date)   BMI 38.62 kg/m    FHT:  FHR: 120 bpm, variability: moderate,  accelerations:  Present,  decelerations:  Present prolonged UC:   regular, every 2-5 minutes SVE:   Dilation: 2 Effacement (%): Thick Station: -3 Exam by:: Veronica Rieger, RN FB removed by RN -- cervical exam same as when FB placed  Labs: Lab Results  Component Value Date   WBC 6.0 04/27/2019   HGB 10.5 (L) 04/27/2019   HCT 34.0 (L) 04/27/2019   MCV 81.0 04/27/2019   PLT 205 04/27/2019    Assessment / Plan: Induction of labor due to gestational diabetes,  progressing well on pitocin  Labor: not in labor at this time Preeclampsia:  n/a Fetal Wellbeing:  Category II Pain Control:  Labor support without medications I/D:  n/a Anticipated MOD:  Cautious for NSVD Continue with frequent position changes, and IVF bolus Will consider starting Pitocin after 1148  Veronica Mora, MSN, CNM 04/27/2019, 8:45 AM

## 2019-04-27 NOTE — H&P (Signed)
LABOR AND DELIVERY ADMISSION HISTORY AND PHYSICAL NOTE  Veronica Torres is a 33 y.o. female G4P1112 with IUP at [redacted]w[redacted]d by LMP c/w early US presenting for IOL for GDM. She reports occasional HA and floaters over the past 2 days, no hx of elevated BP in the office.She reports positive fetal movement. She denies leakage of fluid or vaginal bleeding. She denies contractions or cramping.   Prenatal History/Complications: PNC at [facility] Pregnancy complications:  - Gestational diabetes, diet controlled  - History of preterm delivery at 36.1 weeks   Past Medical History: Past Medical History:  Diagnosis Date  . Gestational diabetes   . Medical history non-contributory     Past Surgical History: Past Surgical History:  Procedure Laterality Date  . TOOTH EXTRACTION      Obstetrical History: OB History    Gravida  4   Para  2   Term  1   Preterm  1   AB  1   Living  2     SAB  1   TAB      Ectopic      Multiple  0   Live Births  2           Social History: Social History   Socioeconomic History  . Marital status: Married    Spouse name: Not on file  . Number of children: Not on file  . Years of education: Not on file  . Highest education level: Not on file  Occupational History  . Not on file  Social Needs  . Financial resource strain: Not hard at all  . Food insecurity:    Worry: Never true    Inability: Never true  . Transportation needs:    Medical: No    Non-medical: Not on file  Tobacco Use  . Smoking status: Never Smoker  . Smokeless tobacco: Never Used  Substance and Sexual Activity  . Alcohol use: No  . Drug use: No  . Sexual activity: Yes    Birth control/protection: None  Lifestyle  . Physical activity:    Days per week: Not on file    Minutes per session: Not on file  . Stress: Not at all  Relationships  . Social connections:    Talks on phone: Not on file    Gets together: Not on file    Attends religious service: Not on file   Active member of club or organization: Not on file    Attends meetings of clubs or organizations: Not on file    Relationship status: Not on file  Other Topics Concern  . Not on file  Social History Narrative  . Not on file    Family History: No family history on file.  Allergies: No Known Allergies  Medications Prior to Admission  Medication Sig Dispense Refill Last Dose  . Accu-Chek FastClix Lancets MISC 1 Units by Percutaneous route 4 (four) times daily. 100 each 12 Taking  . Blood Glucose Monitoring Suppl (ONETOUCH VERIO IQ SYSTEM) w/Device KIT 1 Device by Does not apply route 4 (four) times daily as needed. 1 kit 0 Taking  . glucose blood test strip Use as instructed 100 each 12 Taking  . Prenatal Vit-Fe Phos-FA-Omega (VITAFOL GUMMIES) 3.33-0.333-34.8 MG CHEW Chew 3 each by mouth daily. 90 tablet 11 Taking     Review of Systems  All systems reviewed and negative except as stated in HPI  Physical Exam Last menstrual period 07/23/2018, unknown if currently breastfeeding. General appearance: alert, cooperative   and no distress Lungs: clear to auscultation bilaterally Heart: regular rate and rhythm Abdomen: soft, non-tender; bowel sounds normal Extremities: No calf swelling or tenderness Presentation: cephalic Fetal monitoring: 130/ moderate/ +accels/ no decelerations  Uterine activity: 5-7/ mild by palpation     Prenatal labs: ABO, Rh: A/Positive/-- (11/26 1052) Antibody: Negative (11/26 1052) Rubella: 1.67 (11/26 1052) RPR: Non Reactive (03/06 0832)  HBsAg: Negative (11/26 1052)  HIV: Non Reactive (03/06 0832)  GC/Chlamydia: negative  GBS:   Negative  2 hr Glucola: abnormal, GDM  Genetic screening:  Declined Anatomy US: normal  Nursing Staff Provider  Office Location  [facility] Dating   LMP c/w early [place]  Language  English Anatomy US  Normal  Flu Vaccine  Declined  Genetic Screen   declined  TDaP vaccine   Declined on  03/11/19 Hgb A1C or  GTT Early: 5.8  Third  trimester: GDM   Rhogam   n/a   LAB RESULTS   Feeding Plan Breastfeed Blood Type A/Positive/-- (11/26 1052)   Contraception   Lactational amenorrhea  Antibody Negative (11/26 1052)  Circumcision   circ if boy Rubella 1.67 (11/26 1052)  [facility] RPR Non Reactive (11/26 1052)   Support Person  husband named Veronica Torres HBsAg Negative (11/26 1052)     HIV Non Reactive (11/26 1052)    GBS  negative    Pap Neg 10/2018    Hgb Electro   normal    CF  neg    SMA  neg   Prenatal Transfer Tool  Maternal Diabetes: Yes:  Diabetes Type:  Diet controlled Genetic Screening: Declined Maternal Ultrasounds/Referrals: Normal Fetal Ultrasounds or other Referrals:  None Maternal Substance Abuse:  No Significant Maternal Medications:  None Significant Maternal Lab Results: Lab values include: Group B Strep negative  No results found for this or any previous visit (from the past 24 hour(s)).  Patient Active Problem List   Diagnosis Date Noted  . Encounter for induction of labor 04/27/2019  . Obesity in pregnancy 03/11/2019  . Gestational diabetes 03/11/2019  . Supervision of high risk pregnancy, antepartum 11/04/2018  . History of miscarriage 08/15/2018  . History of preterm delivery, currently pregnant 11/30/2015    Assessment: Veronica Torres is a 33 y.o. S2G3151 at 15w5dhere for IOL for A1GDM   #Labor: IOL with FB and cytotec  #Pain: Pain medication ordered PRN  #FWB: Cat I  #ID:  GBS neg  #MOF: Breast #MOC:Unsure  #Circ:  n/a  RLajean Manes Torres 04/27/2019, 8:32 AM

## 2019-04-27 NOTE — Discharge Summary (Signed)
Obstetrics Discharge Summary [facility]N [facility]   Patient Name: Veronica Torres DOB: [birth year] MRN: [id number]  Date of admission: 04/27/2019 Delivering MD: Adam Phenix   Date of discharge: 04/28/2019  Admitting diagnosis: pregnancy Intrauterine pregnancy: [redacted]w[redacted]d     Secondary diagnosis:   Principal Problem:   Encounter for induction of labor Active Problems:   History of preterm delivery, currently pregnant   Obesity in pregnancy   Gestational diabetes   Gestational diabetes mellitus, antepartum   Cesarean delivery delivered    Discharge diagnosis: Term Pregnancy Delivered by Cesarean section                               Postpartum procedures: None  Complications:   Outpatient Follow-Up: [ ]  incision check [ ]  2-hr GTT [ ]  continue to counsel on contraception - current plan is lactational amenorrhea   Hospital course: Veronica Torres is a 33 y.o. [redacted]w[redacted]d who was admitted for induction of labor for A1GDM. Her pregnancy was complicated by above noted. Her labor course was notable for induction which started with FB and cytotec. Soon after administration of cytotec, developed deep and recurrent fetal decelerations which did not improve with intrauterine resuscitation. Decision made to proceed with urgent primary LTCS which was notable for direct OP presentation with thick meconium fluid. Infant vigorous at delivery but went to NICU for respiratory distress. Please see delivery/op note for additional details. Her postpartum course was uncomplicated.By day of discharge, she was passing flatus, urinating, eating and drinking without difficulty. Her pain was well-controlled, and she was discharged home on POD 1 due to [facility] transfer [facility]. She will follow-up in clinic in 6 weeks.   Physical exam  Vitals:   04/28/19 0755 04/28/19 0800 04/28/19 0822 04/28/19 1148  BP:   114/65 (!) 100/58  Pulse:   64 80  Resp:   20 18  Temp:   97.8 F (36.6 C) 97.9 F (36.6 C)  TempSrc:   Oral  Oral  SpO2: 99% 100% 100% 100%  Weight:      Height:        Labs: Lab Results  Component Value Date   WBC 11.3 (H) 04/28/2019   HGB 8.9 (L) 04/28/2019   HCT 28.4 (L) 04/28/2019   MCV 81.4 04/28/2019   PLT 193 04/28/2019   CMP Latest Ref Rng & Units 04/27/2019  Glucose 70 - 99 mg/dL 88  BUN 6 - 20 mg/dL <5(D)  Creatinine 9.74 - 1.00 mg/dL 1.63  Sodium 845 - 364 mmol/L 135  Potassium 3.5 - 5.1 mmol/L 4.1  Chloride 98 - 111 mmol/L 108  CO2 22 - 32 mmol/L 19(L)  Calcium 8.9 - 10.3 mg/dL 6.8(E)  Total Protein 6.5 - 8.1 g/dL 6.4(L)  Total Bilirubin 0.3 - 1.2 mg/dL 0.5  Alkaline Phos 38 - 126 U/L 102  AST 15 - 41 U/L 22  ALT 0 - 44 U/L 13    Discharge instructions: Per After Visit Summary and "Baby and Me Booklet"  After visit meds:  Allergies as of 04/28/2019   No Known Allergies     Medication List    TAKE these medications   oxyCODONE-acetaminophen 5-325 MG tablet Commonly known as:  PERCOCET/ROXICET Take 1-2 tablets by mouth every 6 (six) hours as needed.   Vitafol Gummies 3.33-0.333-34.8 MG Chew Chew 3 each by mouth daily.       Postpartum contraception: None Diet: Routine Diet Activity: Advance as tolerated.  Pelvic rest for 6 weeks.   Follow-up Appt: Future Appointments  Date Time Provider Department Center  05/11/2019  2:00 PM [facility] NURSE [facility]  06/09/2019  8:15 AM Marny LowensteinWenzel, Julie N, PA-C [facility]  06/09/2019  8:50 AM [facility] LAB [facility]   Follow-up Visit:No follow-ups on file.  Please schedule this patient for Postpartum visit in: 4 weeks with the following provider: Any provider  For C/S patients schedule nurse incision check in weeks 2 weeks: no  High risk pregnancy complicated by: A1GDM  Delivery mode: CS  Anticipated Birth Control: other/unsure  PP Procedures needed: Incision check  2hr GTT  Schedule Integrated BH visit: no   Newborn Data: Live born female  Birth Weight: 7 lb 11.1 oz (3490 g) APGAR: 5, 8  Newborn Delivery    Birth date/time:  04/27/2019 09:36:00 Delivery type:  C-Section, Low Transverse Trial of labor:  Yes C-section categorization:  Primary     Baby Feeding: Bottle and Breast Disposition:transferred to [facility]

## 2019-04-27 NOTE — Anesthesia Preprocedure Evaluation (Addendum)
Anesthesia Evaluation  Patient identified by MRN, date of birth, ID band Patient awake    Reviewed: Allergy & Precautions, NPO status , Patient's Chart, lab work & pertinent test results  History of Anesthesia Complications Negative for: history of anesthetic complications  Airway Mallampati: II  TM Distance: >3 FB Neck ROM: Full    Dental  (+) Dental Advisory Given   Pulmonary neg pulmonary ROS,    breath sounds clear to auscultation       Cardiovascular negative cardio ROS   Rhythm:Regular Rate:Normal     Neuro/Psych negative neurological ROS     GI/Hepatic Neg liver ROS, GERD  ,  Endo/Other  diabetes (glu 85), GestationalMorbid obesity  Renal/GU negative Renal ROS     Musculoskeletal   Abdominal (+) + obese,   Peds  Hematology plt 205k   Anesthesia Other Findings   Reproductive/Obstetrics (+) Pregnancy                            Anesthesia Physical Anesthesia Plan  ASA: III and emergent  Anesthesia Plan: Spinal   Post-op Pain Management:    Induction:   PONV Risk Score and Plan: 2 and Ondansetron, Dexamethasone and Treatment may vary due to age or medical condition  Airway Management Planned: Natural Airway  Additional Equipment:   Intra-op Plan:   Post-operative Plan:   Informed Consent: I have reviewed the patients History and Physical, chart, labs and discussed the procedure including the risks, benefits and alternatives for the proposed anesthesia with the patient or authorized representative who has indicated his/her understanding and acceptance.     Dental advisory given  Plan Discussed with: CRNA and Surgeon  Anesthesia Plan Comments:        Anesthesia Quick Evaluation

## 2019-04-27 NOTE — Progress Notes (Signed)
Subjective: Veronica Torres is a 33 y.o. [id number] at [redacted]w[redacted]d by LMP admitted for induction of labor due to Gestational diabetes. Called back to patient's room for prolonged decel down to 60's for 3-4 mins. Dr. Debroah Loop called to meet CNM in room for Urgent C/S prep.  Objective: BP 124/72   Pulse 75   Temp 98.3 F (36.8 C) (Oral)   Resp 16   Ht 5\' 4"  (1.626 m)   Wt 102.1 kg   LMP 07/23/2018 (Exact Date)   BMI 38.62 kg/m    FHT:  FHR: 120 bpm, variability: moderate,  accelerations:  Abscent,  decelerations:  Present prolonged x 3-4 mins UC:   regular, every 2-5 minutes SVE:   Dilation: 2 Effacement (%): Thick Station: -3 Exam by:: Vernona Rieger, RN  Labs: Lab Results  Component Value Date   WBC 6.0 04/27/2019   HGB 10.5 (L) 04/27/2019   HCT 34.0 (L) 04/27/2019   MCV 81.0 04/27/2019   PLT 205 04/27/2019    Assessment / Plan: Induction of labor due to gestational diabetes,  progressing well on pitocin  Labor: not in labor Preeclampsia:  n/a Fetal Wellbeing:  Category II Pain Control:  Labor support without medications I/D:  n/a Anticipated MOD:  Urgent C/S per Dr. Babette Relic MSN, CNM 04/27/2019, 9:15 AM

## 2019-04-27 NOTE — Progress Notes (Signed)
Noted to have repetitive deep variables and prolonged decels remote from delivery, I called for urgent primary cesarean section The risks of cesarean section discussed with the patient included but were not limited to: bleeding which may require transfusion or reoperation; infection which may require antibiotics; injury to bowel, bladder, ureters or other surrounding organs; injury to the fetus; need for additional procedures including hysterectomy in the event of a life-threatening hemorrhage; placental abnormalities wth subsequent pregnancies, incisional problems, thromboembolic phenomenon and other postoperative/anesthesia complications. The patient concurred with the proposed plan, giving informed written consent for the procedure.   Patient has been NPO since 1200 she will remain NPO for procedure. Anesthesia and OR aware. Preoperative prophylactic antibiotics and SCDs ordered on call to the OR.  To OR when ready.  Adam Phenix, MD 04/27/2019 9:18 AM

## 2019-04-28 LAB — CBC
HCT: 28.4 % — ABNORMAL LOW (ref 36.0–46.0)
Hemoglobin: 8.9 g/dL — ABNORMAL LOW (ref 12.0–15.0)
MCH: 25.5 pg — ABNORMAL LOW (ref 26.0–34.0)
MCHC: 31.3 g/dL (ref 30.0–36.0)
MCV: 81.4 fL (ref 80.0–100.0)
Platelets: 193 10*3/uL (ref 150–400)
RBC: 3.49 MIL/uL — ABNORMAL LOW (ref 3.87–5.11)
RDW: 14.6 % (ref 11.5–15.5)
WBC: 11.3 10*3/uL — ABNORMAL HIGH (ref 4.0–10.5)
nRBC: 0 % (ref 0.0–0.2)

## 2019-04-28 MED ORDER — OXYCODONE-ACETAMINOPHEN 5-325 MG PO TABS: 1.0000 | ORAL_TABLET | Freq: Four times a day (QID) | ORAL | 0 refills | Status: DC | PRN

## 2019-04-28 NOTE — Progress Notes (Signed)
Subjective: Postpartum Day 1: Cesarean Delivery Patient reports tolerating PO and no problems voiding.    Objective: Vital signs in last 24 hours: Temp:  [97.7 F (36.5 C)-98.8 F (37.1 C)] 97.9 F (36.6 C) (05/19 0314) Pulse Rate:  [61-81] 61 (05/19 0314) Resp:  [11-27] 20 (05/19 0314) BP: (92-132)/(46-118) 92/64 (05/19 0314) SpO2:  [91 %-100 %] 98 % (05/19 0600)  Physical Exam:  General: alert, cooperative and appears stated age Lochia: appropriate Uterine Fundus: firm Incision: no significant drainage, no significant erythema DVT Evaluation: No evidence of DVT seen on physical exam.  Recent Labs    04/27/19 0727 04/28/19 0518  HGB 10.5* 8.9*  HCT 34.0* 28.4*    Assessment/Plan: Status post Cesarean section. Doing well postoperatively.  Continue current care. May desire early discharge due to baby at [facility]. Will re-evaluate later this afternoon.  Reva Bores 04/28/2019, 8:03 AM

## 2019-04-28 NOTE — Discharge Instructions (Signed)

## 2019-04-28 NOTE — Progress Notes (Signed)
Discharge teaching complete with pt. Pt understood all information and did not have any questions. Pt discharged home with family.  

## 2019-04-28 NOTE — Lactation Note (Signed)
Lactation Consultation Note:  Mother has term infant that was transferred to [facility]. Infant has possible echmo and possible trisomy 68.   Mother reports that she has [employer] . She was advised to phone and inquire about pump availability. Mother reports that she was active with [facility] with her 32 yr old that she breastfed for 22 months.   Discussed  going to [facility] this afternoon to get a pump. Mother reports that she may not be discharged until the evening.  WIC loaner pump was discussed.   Mother has been pumping with [facility]. She reports that she is able to get drops of colostrum.  Reviewed importance of pumping every 2-3 hours for 15-20 mins.  Advised mother to take pump parts when she goes home. Staff nurse gave mother a hand pump with instructions.  WIC referral sent to [facility] and informed mother that she needed to phone [facility].   Mother very eager to get to [facility] to see infant.  Mother was given Providing Breastmilk for you NICU baby. Advised mother to follow up with staff at [facility] to make sure that she is using correct guidelines for their breastmilk storage.   Mother may call LC if she thinks she would want a [facility] Loaner pump.  Mother is aware of available LC services and community support.   Patient Name: Veronica Torres MVEHM'C Date: 04/28/2019 Reason for consult: Initial assessment;NICU baby;Term   Maternal Data    Feeding    LATCH Score                   Interventions Interventions: Expressed milk;Hand pump;DEBP  Lactation Tools Discussed/Used WIC Program: No Pump Review: Setup, frequency, and cleaning;Milk Storage(all taught by stafff nurse when sat up with pump)   Consult Status Consult Status: Complete    Michel Bickers 04/28/2019, 12:57 PM

## 2019-05-07 ENCOUNTER — Telehealth: Payer: Self-pay | Admitting: Family Medicine

## 2019-05-07 NOTE — Telephone Encounter (Signed)
Attempted to call patient about related COVID-19 questions. Had to leave a VM.

## 2019-05-11 ENCOUNTER — Ambulatory Visit (INDEPENDENT_AMBULATORY_CARE_PROVIDER_SITE_OTHER): Payer: Commercial Managed Care - PPO | Admitting: General Practice

## 2019-05-11 ENCOUNTER — Other Ambulatory Visit: Payer: Self-pay

## 2019-05-11 VITALS — BP 115/66 | HR 70 | Ht 64.0 in | Wt 204.0 lb

## 2019-05-11 DIAGNOSIS — Z5189 Encounter for other specified aftercare: Secondary | ICD-10-CM

## 2019-05-11 NOTE — Progress Notes (Signed)
Patient presents to office today for wound check following primary c-section on May 18th. Patient reports doing well since surgery. Incision is clean, dry & intact- appears to be well healed & approximated. Wound care reviewed and signs & symptoms of infection. Patient will follow up on 6/30 for pp visit & 2 hr gtt. Patient had no questions.  Chase Caller RN BSN 05/11/19

## 2019-05-15 NOTE — Progress Notes (Signed)
I have reviewed the chart and agree with nursing staff's documentation of this patient's encounter.  Dunedin Bing, MD 05/15/2019 2:32 PM

## 2019-06-08 ENCOUNTER — Other Ambulatory Visit: Payer: Self-pay | Admitting: *Deleted

## 2019-06-08 DIAGNOSIS — O24429 Gestational diabetes mellitus in childbirth, unspecified control: Secondary | ICD-10-CM

## 2019-06-09 ENCOUNTER — Encounter: Payer: Self-pay | Admitting: Family Medicine

## 2019-06-09 ENCOUNTER — Other Ambulatory Visit: Payer: Commercial Managed Care - PPO

## 2019-06-09 ENCOUNTER — Ambulatory Visit: Payer: Commercial Managed Care - PPO | Admitting: Medical

## 2020-10-20 ENCOUNTER — Other Ambulatory Visit: Payer: Self-pay

## 2020-10-20 ENCOUNTER — Ambulatory Visit (INDEPENDENT_AMBULATORY_CARE_PROVIDER_SITE_OTHER): Payer: Commercial Managed Care - PPO | Admitting: *Deleted

## 2020-10-20 ENCOUNTER — Inpatient Hospital Stay (HOSPITAL_COMMUNITY)
Admission: AD | Admit: 2020-10-20 | Discharge: 2020-10-20 | Disposition: A | Discharge status: Home / Self Care | Payer: Commercial Managed Care - PPO | Attending: Obstetrics and Gynecology | Admitting: Obstetrics and Gynecology

## 2020-10-20 DIAGNOSIS — Z3200 Encounter for pregnancy test, result unknown: Secondary | ICD-10-CM | POA: Diagnosis present

## 2020-10-20 DIAGNOSIS — Z3201 Encounter for pregnancy test, result positive: Secondary | ICD-10-CM | POA: Diagnosis not present

## 2020-10-20 DIAGNOSIS — Z3490 Encounter for supervision of normal pregnancy, unspecified, unspecified trimester: Secondary | ICD-10-CM

## 2020-10-20 LAB — POCT PREGNANCY, URINE: Preg Test, Ur: POSITIVE — AB

## 2020-10-20 NOTE — MAU Note (Signed)
Presents for pregnancy confirmation.  Reports +HPT.  Unsure of LMP.

## 2020-10-20 NOTE — Progress Notes (Signed)
Pt submitted urine specimen for pregnancy test.  I called and informed her of positive UPT. She reports that her periods are not regular due to she is still lactating. Her approximate LMP is 8/15 which yields EDD 04/30/21, now 12w 5d.  She denies vaginal bleeding or abdominal pain. I advised that she will need [place] for confirmation of pregnancy dating. I will schedule the ultrasound appt as well as start of prenatal care. She will be notified via Mychart. If she develops heavy vaginal bleeding or abdominal pain, she should go to MAU for evaluation.  Pt voiced understanding of all information and instructions given.

## 2020-10-20 NOTE — MAU Provider Note (Signed)
First Provider Initiated Contact with Patient 10/20/20 1527      S Ms. Veronica Torres is a 34 y.o. [phone] patient who presents to MAU today with complaint of none.  Patient came to MAU requesting a pregnancy test without pain or bleeding. Patient reports +UPT at home.  O BP 127/71 (BP Location: Right Arm)   Pulse 78   Temp 99 F (37.2 C) (Oral)   Resp 18   Ht 5\' 4"  (1.626 m)   Wt 92.7 kg   LMP  (LMP Unknown)   SpO2 100%   BMI 35.09 kg/m    Patient Vitals for the past 24 hrs:  BP Temp Temp src Pulse Resp SpO2 Height Weight  10/20/20 1435 127/71 99 F (37.2 C) Oral 78 18 100 % -- --  10/20/20 1433 -- -- -- -- -- -- 5\' 4"  (1.626 m) 92.7 kg   Physical Exam Vitals and nursing note reviewed.  Constitutional:      General: She is not in acute distress.    Appearance: Normal appearance. She is not ill-appearing, toxic-appearing or diaphoretic.  HENT:     Head: Normocephalic and atraumatic.  Pulmonary:     Effort: Pulmonary effort is normal.  Neurological:     Mental Status: She is alert and oriented to person, place, and time.  Psychiatric:        Mood and Affect: Mood normal.        Behavior: Behavior normal.        Thought Content: Thought content normal.        Judgment: Judgment normal.    A Medical screening exam complete  P Discharge from MAU in stable condition List of options for follow-up given including information for [facility] for walk-in pregnancy test Warning signs for worsening condition that would warrant emergency follow-up discussed Patient may return to MAU as needed   Scotland Dost, , NP 10/20/2020 3:54 PM

## 2020-10-21 ENCOUNTER — Encounter: Payer: Self-pay | Admitting: *Deleted

## 2020-10-23 NOTE — Progress Notes (Signed)
Patient was assessed and managed by nursing staff during this encounter. I have reviewed the chart and agree with the documentation and plan. I have also made any necessary editorial changes. ° °Corwyn Vora, MD °10/23/2020 1:51 PM °

## 2020-10-25 ENCOUNTER — Other Ambulatory Visit: Payer: Self-pay | Admitting: Obstetrics & Gynecology

## 2020-10-25 ENCOUNTER — Ambulatory Visit
Admission: RE | Admit: 2020-10-25 | Discharge: 2020-10-25 | Disposition: A | Discharge status: Home / Self Care | Payer: Commercial Managed Care - PPO | Source: Ambulatory Visit | Attending: Obstetrics & Gynecology | Admitting: Obstetrics & Gynecology

## 2020-10-25 ENCOUNTER — Other Ambulatory Visit: Payer: Self-pay

## 2020-10-25 DIAGNOSIS — Z3490 Encounter for supervision of normal pregnancy, unspecified, unspecified trimester: Secondary | ICD-10-CM

## 2020-10-25 DIAGNOSIS — Z3200 Encounter for pregnancy test, result unknown: Secondary | ICD-10-CM | POA: Diagnosis not present

## 2020-10-27 ENCOUNTER — Ambulatory Visit (INDEPENDENT_AMBULATORY_CARE_PROVIDER_SITE_OTHER): Payer: Commercial Managed Care - PPO | Admitting: Obstetrics & Gynecology

## 2020-10-27 ENCOUNTER — Other Ambulatory Visit: Payer: Self-pay

## 2020-10-27 ENCOUNTER — Encounter: Payer: Self-pay | Admitting: Obstetrics & Gynecology

## 2020-10-27 ENCOUNTER — Other Ambulatory Visit (HOSPITAL_COMMUNITY)
Admission: RE | Admit: 2020-10-27 | Discharge: 2020-10-27 | Disposition: A | Discharge status: Home / Self Care | Payer: Commercial Managed Care - PPO | Source: Ambulatory Visit | Attending: Obstetrics & Gynecology | Admitting: Obstetrics & Gynecology

## 2020-10-27 VITALS — BP 109/71 | HR 78 | Wt 202.6 lb

## 2020-10-27 DIAGNOSIS — O09899 Supervision of other high risk pregnancies, unspecified trimester: Secondary | ICD-10-CM | POA: Diagnosis present

## 2020-10-27 DIAGNOSIS — O0990 Supervision of high risk pregnancy, unspecified, unspecified trimester: Secondary | ICD-10-CM | POA: Diagnosis present

## 2020-10-27 DIAGNOSIS — Z3481 Encounter for supervision of other normal pregnancy, first trimester: Secondary | ICD-10-CM

## 2020-10-27 DIAGNOSIS — Z8632 Personal history of gestational diabetes: Secondary | ICD-10-CM

## 2020-10-27 NOTE — Progress Notes (Signed)
Sakia her today for her first Prenatal visit.  We discussed her EDD of 05/09/21 that is based on [place]. Pt is G5/P2113. I reviewed her allergies, medications, Medical/Surgical/OB history, and appropriate screenings. I informed her of [facility] services. Based on history, this is a/an complicated by history PTD and GDM pregnancy.  Concerns addressed today  MyChart/Babyscripts MyChart access verified. I explained pt will have some visits in office and some virtually. Babyscripts instructions given. Account successfully created and app downloaded.  Blood Pressure Cuff She confirms she still has bp cuff from previous pregnancy.  Explained after first prenatal appt pt will check weekly and document in Babyscripts.  Anatomy US Explained first scheduled [place] will be around 19 weeks. Anatomy US scheduled Labs Discussed Avelina Laine genetic screening with patient. She declines  Routine prenatal labs needed.  Given new patient prenatal  information packet.  Covid vaccine Has not had covid vaccine   Wic - has WIC for her older children,not for herself. Accepted WIC referral.  Jaeleah Smyser,RN 10/27/2020  2:53 PM

## 2020-10-27 NOTE — Progress Notes (Signed)
  Subjective:    Veronica Torres is a [id number] [redacted]w[redacted]d being seen today for her first obstetrical visit.  Her obstetrical history is significant for obesity and h/o late PTD and h/o infant with down syndrome. . Patient does intend to breast feed. Pregnancy history fully reviewed.  Patient reports backache and nausea.  Vitals:   10/27/20 1455  BP: 109/71  Pulse: 78  Weight: 202 lb 9.6 oz (91.9 kg)    HISTORY: OB History  Gravida Para Term Preterm AB Living  5 3 2 1 1 3   SAB TAB Ectopic Multiple Live Births  1     0 3    # Outcome Date GA Lbr Len/2nd Weight Sex Delivery Anes PTL Lv  5 Current           4 Term 04/27/19 [redacted]w[redacted]d  7 lb 11.1 oz (3.49 kg) F CS-LTranv Spinal  LIV     Birth Comments: GDM- diet controlled  3 SAB 03/2018 [redacted]w[redacted]d         2 Term 05/10/16 [redacted]w[redacted]d 20:20 / 00:45 6 lb 13 oz (3.09 kg) F Vag-Spont EPI  LIV  1 Preterm 04/23/07 [redacted]w[redacted]d  5 lb 15 oz (2.693 kg) M Vag-Spont EPI N LIV     Birth Comments: states just went into labor and could not stop it; baby to NICU   Past Medical History:  Diagnosis Date  . Gestational diabetes   . Medical history non-contributory   . Preterm labor    PTD at 41   Past Surgical History:  Procedure Laterality Date  . CESAREAN SECTION N/A 04/27/2019   Procedure: CESAREAN SECTION;  Surgeon: 04/29/2019, MD;  Location: [facility] LD ORS;  Service: Obstetrics;  Laterality: N/A;  . TOOTH EXTRACTION     History reviewed. No pertinent family history.   Exam    Uterus:     Pelvic Exam:    Perineum: No Hemorrhoids   Vulva: Bartholin's, Urethra, Skene's normal   Vagina:  normal mucosa   Skin: normal coloration and turgor, no rashes    Neurologic: negative   Extremities: normal strength, tone, and muscle mass   HEENT PERRLA   Mouth/Teeth mucous membranes moist, pharynx normal without lesions   Neck no masses   Cardiovascular: regular rate and rhythm, no murmurs or gallops   Respiratory:  appears well, vitals normal, no respiratory distress,  acyanotic, normal RR, ear and throat exam is normal, neck free of mass or lymphadenopathy, chest clear, no wheezing, crepitations, rhonchi, normal symmetric air entry   Abdomen: soft, non-tender; bowel sounds normal; no masses,  no organomegaly   Urinary: urethral meatus normal      Assessment:    Pregnancy: [facility] Patient Active Problem List   Diagnosis Date Noted  . Supervision of high risk pregnancy, antepartum 10/27/2020  . History of preterm delivery, currently pregnant 10/27/2020  . History of diet controlled gestational diabetes mellitus (GDM) 10/27/2020  . Encounter for induction of labor 04/27/2019  . Obesity in pregnancy 03/11/2019  . Gestational diabetes 03/11/2019  . History of miscarriage 08/15/2018        Plan:     Initial labs drawn. Prenatal vitamins. Problem list reviewed and updated. Genetic Screening discussed Integrated Screen: declines, pt has h/o child with Down Syndrome.  Ultrasound discussed; fetal survey: ordered.  Follow up in 4 weeks. 75% of 30 min visit spent on counseling and coordination of care.  Follow up prn concerns.    10/15/2018 10/27/2020

## 2020-10-28 LAB — HEMOGLOBIN A1C
Est. average glucose Bld gHb Est-mCnc: 120 mg/dL
Hgb A1c MFr Bld: 5.8 % — ABNORMAL HIGH (ref 4.8–5.6)

## 2020-10-28 LAB — CBC/D/PLT+RPR+RH+ABO+RUB AB...
Antibody Screen: NEGATIVE
Basophils Absolute: 0 10*3/uL (ref 0.0–0.2)
Basos: 1 %
EOS (ABSOLUTE): 0 10*3/uL (ref 0.0–0.4)
Eos: 1 %
HCV Ab: <0.1 s/co ratio (ref 0.0–0.9)
HIV Screen 4th Generation wRfx: NONREACTIVE
Hematocrit: 37.8 % (ref 34.0–46.6)
Hemoglobin: 12.7 g/dL (ref 11.1–15.9)
Hepatitis B Surface Ag: NEGATIVE
Immature Grans (Abs): 0 10*3/uL (ref 0.0–0.1)
Immature Granulocytes: 0 %
Lymphocytes Absolute: 1.5 10*3/uL (ref 0.7–3.1)
Lymphs: 23 %
MCH: 27.9 pg (ref 26.6–33.0)
MCHC: 33.6 g/dL (ref 31.5–35.7)
MCV: 83 fL (ref 79–97)
Monocytes Absolute: 0.5 10*3/uL (ref 0.1–0.9)
Monocytes: 7 %
Neutrophils Absolute: 4.6 10*3/uL (ref 1.4–7.0)
Neutrophils: 68 %
Platelets: 235 10*3/uL (ref 150–450)
RBC: 4.55 x10E6/uL (ref 3.77–5.28)
RDW: 13.1 % (ref 11.7–15.4)
RPR Ser Ql: NONREACTIVE
Rh Factor: POSITIVE
Rubella Antibodies, IGG: 1.45 index (ref >0.99)
WBC: 6.6 10*3/uL (ref 3.4–10.8)

## 2020-10-28 LAB — GC/CHLAMYDIA PROBE AMP (~~LOC~~) NOT AT ARMC
Chlamydia: NEGATIVE
Comment: NEGATIVE
Comment: NORMAL
Neisseria Gonorrhea: NEGATIVE

## 2020-10-28 LAB — HCV INTERPRETATION

## 2020-10-29 LAB — CULTURE, OB URINE

## 2020-10-29 LAB — URINE CULTURE, OB REFLEX

## 2020-11-12 ENCOUNTER — Other Ambulatory Visit: Payer: Self-pay

## 2020-11-12 ENCOUNTER — Inpatient Hospital Stay (HOSPITAL_COMMUNITY)
Admission: AD | Admit: 2020-11-12 | Discharge: 2020-11-12 | Disposition: A | Discharge status: Home / Self Care | Payer: Commercial Managed Care - PPO | Attending: Obstetrics and Gynecology | Admitting: Obstetrics and Gynecology

## 2020-11-12 ENCOUNTER — Encounter (HOSPITAL_COMMUNITY): Payer: Self-pay | Admitting: Obstetrics and Gynecology

## 2020-11-12 DIAGNOSIS — O99891 Other specified diseases and conditions complicating pregnancy: Secondary | ICD-10-CM | POA: Diagnosis not present

## 2020-11-12 DIAGNOSIS — B9689 Other specified bacterial agents as the cause of diseases classified elsewhere: Secondary | ICD-10-CM | POA: Diagnosis not present

## 2020-11-12 DIAGNOSIS — R102 Pelvic and perineal pain: Secondary | ICD-10-CM | POA: Diagnosis not present

## 2020-11-12 DIAGNOSIS — N760 Acute vaginitis: Secondary | ICD-10-CM

## 2020-11-12 DIAGNOSIS — O23592 Infection of other part of genital tract in pregnancy, second trimester: Secondary | ICD-10-CM | POA: Diagnosis not present

## 2020-11-12 DIAGNOSIS — N949 Unspecified condition associated with female genital organs and menstrual cycle: Secondary | ICD-10-CM

## 2020-11-12 DIAGNOSIS — Z3A14 14 weeks gestation of pregnancy: Secondary | ICD-10-CM

## 2020-11-12 LAB — CBC
HCT: 36.5 % (ref 36.0–46.0)
Hemoglobin: 12 g/dL (ref 12.0–15.0)
MCH: 27.4 pg (ref 26.0–34.0)
MCHC: 32.9 g/dL (ref 30.0–36.0)
MCV: 83.3 fL (ref 80.0–100.0)
Platelets: 227 10*3/uL (ref 150–400)
RBC: 4.38 MIL/uL (ref 3.87–5.11)
RDW: 12.5 % (ref 11.5–15.5)
WBC: 8.3 10*3/uL (ref 4.0–10.5)
nRBC: 0 % (ref 0.0–0.2)

## 2020-11-12 LAB — COMPREHENSIVE METABOLIC PANEL
ALT: 12 U/L (ref 0–44)
AST: 16 U/L (ref 15–41)
Albumin: 3.5 g/dL (ref 3.5–5.0)
Alkaline Phosphatase: 39 U/L (ref 38–126)
Anion gap: 11 (ref 5–15)
BUN: 5 mg/dL — ABNORMAL LOW (ref 6–20)
CO2: 20 mmol/L — ABNORMAL LOW (ref 22–32)
Calcium: 9 mg/dL (ref 8.9–10.3)
Chloride: 106 mmol/L (ref 98–111)
Creatinine, Ser: 0.73 mg/dL (ref 0.44–1.00)
GFR, Estimated: >60 mL/min (ref >60)
Glucose, Bld: 82 mg/dL (ref 70–99)
Potassium: 3.5 mmol/L (ref 3.5–5.1)
Sodium: 137 mmol/L (ref 135–145)
Total Bilirubin: 0.6 mg/dL (ref 0.3–1.2)
Total Protein: 7.2 g/dL (ref 6.5–8.1)

## 2020-11-12 LAB — WET PREP, GENITAL
Sperm: NONE SEEN
Trich, Wet Prep: NONE SEEN
Yeast Wet Prep HPF POC: NONE SEEN

## 2020-11-12 LAB — URINALYSIS, ROUTINE W REFLEX MICROSCOPIC
Bilirubin Urine: NEGATIVE
Glucose, UA: NEGATIVE mg/dL
Hgb urine dipstick: NEGATIVE
Ketones, ur: 20 mg/dL — AB
Leukocytes,Ua: NEGATIVE
Nitrite: NEGATIVE
Protein, ur: 30 mg/dL — AB
Specific Gravity, Urine: 1.028 (ref 1.005–1.030)
pH: 5 (ref 5.0–8.0)

## 2020-11-12 MED ORDER — CYCLOBENZAPRINE HCL 5 MG PO TABS: 5.0000 mg | ORAL_TABLET | ORAL | 0 refills | Status: DC | PRN

## 2020-11-12 MED ORDER — METRONIDAZOLE 500 MG PO TABS: 500.0000 mg | ORAL_TABLET | Freq: Two times a day (BID) | ORAL | 0 refills | Status: DC

## 2020-11-12 MED ORDER — CYCLOBENZAPRINE HCL 5 MG PO TABS
5.0000 mg | ORAL_TABLET | Freq: Once | ORAL | Status: AC
  Administered 2020-11-12: 5 mg via ORAL
  Filled 2020-11-12: qty 1

## 2020-11-12 NOTE — Discharge Instructions (Signed)

## 2020-11-12 NOTE — MAU Provider Note (Signed)
History     CSN: [id number]  Arrival date and time: 11/12/20 1439   First Provider Initiated Contact with Patient 11/12/20 1517      Chief Complaint  Patient presents with  . Pelvic Pain   HPI Veronica Torres is a 34 y.o. [id number] at [redacted]w[redacted]d who presents with pelvic pain. She states it is an ongoing pain that has gotten worse today since she walked in the parade with her daughter. She states it is intermittent but cannot describe the way it feels. She rates the pain now a 4/10. She denies any leaking or bleeding. Reports feeling fetal movement. She states she also has occasional floaters. She states this happened when she had gestational diabetes in the past and is worried that she has it again. She denies any headaches.   OB History     Gravida  5   Para  3   Term  2   Preterm  1   AB  1   Living  3      SAB  1   TAB      Ectopic      Multiple  0   Live Births  3           Past Medical History:  Diagnosis Date  . Gestational diabetes    last pregnancy  . Medical history non-contributory   . Preterm labor    PTD at 20    Past Surgical History:  Procedure Laterality Date  . CESAREAN SECTION N/A 04/27/2019   Procedure: CESAREAN SECTION;  Surgeon: Adam Phenix, MD;  Location: [facility] LD ORS;  Service: Obstetrics;  Laterality: N/A;  . TOOTH EXTRACTION      History reviewed. No pertinent family history.  Social History   Tobacco Use  . Smoking status: Never Smoker  . Smokeless tobacco: Never Used  Vaping Use  . Vaping Use: Never used  Substance Use Topics  . Alcohol use: No  . Drug use: No    Allergies: No Known Allergies  Medications Prior to Admission  Medication Sig Dispense Refill Last Dose  . Prenatal Vit-Fe Phos-FA-Omega (VITAFOL GUMMIES) 3.33-0.333-34.8 MG CHEW Chew 3 each by mouth daily. 90 tablet 11 11/11/2020 at Unknown time    Review of Systems  Constitutional: Negative.  Negative for fatigue and fever.  HENT: Negative.   Respiratory:  Negative.  Negative for shortness of breath.   Cardiovascular: Negative.  Negative for chest pain.  Gastrointestinal: Negative.  Negative for abdominal pain, constipation, diarrhea, nausea and vomiting.  Genitourinary: Positive for pelvic pain. Negative for dysuria, vaginal bleeding and vaginal discharge.  Neurological: Negative.  Negative for dizziness and headaches.   Physical Exam   Blood pressure 109/64, pulse 90, temperature 98.5 F (36.9 C), temperature source Oral, resp. rate 16, height 5\' 4"  (1.626 m), weight 92.9 kg, last menstrual period 07/24/2020, SpO2 99 %, unknown if currently breastfeeding.  Physical Exam Vitals and nursing note reviewed.  Constitutional:      General: She is not in acute distress.    Appearance: She is well-developed.  HENT:     Head: Normocephalic.  Eyes:     Pupils: Pupils are equal, round, and reactive to light.  Cardiovascular:     Rate and Rhythm: Normal rate and regular rhythm.     Heart sounds: Normal heart sounds.  Pulmonary:     Effort: Pulmonary effort is normal. No respiratory distress.     Breath sounds: Normal breath sounds.  Abdominal:  General: Bowel sounds are normal. There is no distension.     Palpations: Abdomen is soft.     Tenderness: There is no abdominal tenderness.    Skin:    General: Skin is warm and dry.  Neurological:     Mental Status: She is alert and oriented to person, place, and time.     Cranial Nerves: No cranial nerve deficit.     Motor: No weakness.     Deep Tendon Reflexes: Reflexes normal.  Psychiatric:        Behavior: Behavior normal.        Thought Content: Thought content normal.        Judgment: Judgment normal.     Cervix: closed/thick/posterior  FHT: 164 bpm  MAU Course  Procedures Results for orders placed or performed during the hospital encounter of 11/12/20 (from the past 24 hour(s))  Urinalysis, Routine w reflex microscopic Urine, Clean Catch     Status: Abnormal   Collection  Time: 11/12/20  3:19 PM  Result Value Ref Range   Color, Urine YELLOW YELLOW   APPearance CLEAR CLEAR   Specific Gravity, Urine 1.028 1.005 - 1.030   pH 5.0 5.0 - 8.0   Glucose, UA NEGATIVE NEGATIVE mg/dL   Hgb urine dipstick NEGATIVE NEGATIVE   Bilirubin Urine NEGATIVE NEGATIVE   Ketones, ur 20 (A) NEGATIVE mg/dL   Protein, ur 30 (A) NEGATIVE mg/dL   Nitrite NEGATIVE NEGATIVE   Leukocytes,Ua NEGATIVE NEGATIVE   RBC / HPF 0-5 0 - 5 RBC/hpf   WBC, UA 0-5 0 - 5 WBC/hpf   Bacteria, UA RARE (A) NONE SEEN   Squamous Epithelial / LPF 0-5 0 - 5   Mucus PRESENT   Wet prep, genital     Status: Abnormal   Collection Time: 11/12/20  3:30 PM   Specimen: Vaginal  Result Value Ref Range   Yeast Wet Prep HPF POC NONE SEEN NONE SEEN   Trich, Wet Prep NONE SEEN NONE SEEN   Clue Cells Wet Prep HPF POC PRESENT (A) NONE SEEN   WBC, Wet Prep HPF POC MANY (A) NONE SEEN   Sperm NONE SEEN   CBC     Status: None   Collection Time: 11/12/20  3:45 PM  Result Value Ref Range   WBC 8.3 4.0 - 10.5 K/uL   RBC 4.38 3.87 - 5.11 MIL/uL   Hemoglobin 12.0 12.0 - 15.0 g/dL   HCT 69.4 36 - 46 %   MCV 83.3 80.0 - 100.0 fL   MCH 27.4 26.0 - 34.0 pg   MCHC 32.9 30.0 - 36.0 g/dL   RDW 85.4 62.7 - 03.5 %   Platelets 227 150 - 400 K/uL   nRBC 0.0 0.0 - 0.2 %  Comprehensive metabolic panel     Status: Abnormal   Collection Time: 11/12/20  3:45 PM  Result Value Ref Range   Sodium 137 135 - 145 mmol/L   Potassium 3.5 3.5 - 5.1 mmol/L   Chloride 106 98 - 111 mmol/L   CO2 20 (L) 22 - 32 mmol/L   Glucose, Bld 82 70 - 99 mg/dL   BUN 5 (L) 6 - 20 mg/dL   Creatinine, Ser 0.09 0.44 - 1.00 mg/dL   Calcium 9.0 8.9 - 38.1 mg/dL   Total Protein 7.2 6.5 - 8.1 g/dL   Albumin 3.5 3.5 - 5.0 g/dL   AST 16 15 - 41 U/L   ALT 12 0 - 44 U/L   Alkaline Phosphatase 39  38 - 126 U/L   Total Bilirubin 0.6 0.3 - 1.2 mg/dL   GFR, Estimated >39 >76 mL/min   Anion gap 11 5 - 15    MDM UA Wet prep and gc/chlamydia CBC,  CMP Flexeril PO  Assessment and Plan   1. Pain of female symphysis pubis   2. [redacted] weeks gestation of pregnancy   3. Bacterial vaginosis    -Discharge home in stable condition -Rx for metronidazole and flexeril sent to patient's pharmacy -Pain precautions discussed -Patient advised to follow-up with OB as scheduled for prenatal care -Patient may return to MAU as needed or if her condition were to change or worsen   Rolm Bookbinder CNM 11/12/2020, 3:18 PM

## 2020-11-12 NOTE — MAU Note (Signed)
Veronica Torres is a 34 y.o. at [redacted]w[redacted]d here in MAU reporting: pelvic pains for the past 2 weeks, states now it is more consistent and has gotten worse. Having some floaters in her vision. No vaginal bleeding or LOF.   Onset of complaint: ongoing  Pain score: 5/10  Vitals:   11/12/20 1454  BP: 109/64  Pulse: 90  Resp: 16  Temp: 98.5 F (36.9 C)  SpO2: 99%     FHT: 164  Lab orders placed from triage: UA

## 2020-11-14 LAB — GC/CHLAMYDIA PROBE AMP (~~LOC~~) NOT AT ARMC
Chlamydia: NEGATIVE
Comment: NEGATIVE
Comment: NORMAL
Neisseria Gonorrhea: NEGATIVE

## 2020-11-23 ENCOUNTER — Encounter: Payer: Commercial Managed Care - PPO | Admitting: Obstetrics and Gynecology

## 2020-12-10 NOTE — L&D Delivery Note (Signed)
OB/GYN Faculty Practice Delivery Note  Veronica Torres is a 35 y.o. [phone] s/p vaginal delivery at [redacted]w[redacted]d. She was admitted for latent labor in the setting of A1GDM and desire for TOLAC.   ROM: 0h 17m with clear fluid GBS Status: negative Maximum Maternal Temperature: 97.77F  Labor Progress: Pt in latent labor on arrival to MAU. She rapidly progressed to complete cervical dilation s/p AROM for clear fluid. She then had an uncomplicated delivery s/p a brief second stage as noted below.  Delivery Date/Time: 05/09/21 at 0220 Delivery: Called to room and patient was complete and pushing. Head delivered ROA; compound presentation with right hand. No nuchal cord present. Shoulder and body delivered in usual fashion. Infant with spontaneous cry, placed on mother's abdomen, dried and stimulated. Cord clamped x 2 after 1-minute delay, and cut by patient under my direct supervision. Cord blood drawn. Placenta delivered spontaneously with gentle cord traction. Fundus firm with massage and Pitocin. Labia, perineum, vagina, and cervix were inspected, without evidence of lacerations.  Placenta: 3-vessel cord, intact, sent to L&D Complications: none Lacerations: none EBL: 50 ml Analgesia: epidural  Infant: viable female  APGARs 9 & 9  weight per medical record  Lynnda Shields, MD OB/GYN Fellow, Faculty Practice

## 2020-12-15 ENCOUNTER — Encounter: Payer: Self-pay | Admitting: *Deleted

## 2020-12-15 ENCOUNTER — Other Ambulatory Visit: Payer: Self-pay

## 2020-12-15 ENCOUNTER — Ambulatory Visit: Payer: Commercial Managed Care - PPO | Admitting: *Deleted

## 2020-12-15 ENCOUNTER — Ambulatory Visit: Payer: Commercial Managed Care - PPO | Attending: Obstetrics & Gynecology

## 2020-12-15 ENCOUNTER — Other Ambulatory Visit: Payer: Self-pay | Admitting: *Deleted

## 2020-12-15 DIAGNOSIS — O0990 Supervision of high risk pregnancy, unspecified, unspecified trimester: Secondary | ICD-10-CM | POA: Insufficient documentation

## 2020-12-15 DIAGNOSIS — R638 Other symptoms and signs concerning food and fluid intake: Secondary | ICD-10-CM

## 2020-12-15 DIAGNOSIS — O09899 Supervision of other high risk pregnancies, unspecified trimester: Secondary | ICD-10-CM

## 2020-12-15 DIAGNOSIS — Z8632 Personal history of gestational diabetes: Secondary | ICD-10-CM | POA: Diagnosis not present

## 2020-12-16 ENCOUNTER — Ambulatory Visit (INDEPENDENT_AMBULATORY_CARE_PROVIDER_SITE_OTHER): Payer: Commercial Managed Care - PPO | Admitting: Obstetrics & Gynecology

## 2020-12-16 ENCOUNTER — Other Ambulatory Visit: Payer: Self-pay

## 2020-12-16 ENCOUNTER — Encounter: Payer: Self-pay | Admitting: Obstetrics & Gynecology

## 2020-12-16 VITALS — BP 110/70 | HR 81 | Wt 202.7 lb

## 2020-12-16 DIAGNOSIS — O09299 Supervision of pregnancy with other poor reproductive or obstetric history, unspecified trimester: Secondary | ICD-10-CM

## 2020-12-16 DIAGNOSIS — O09291 Supervision of pregnancy with other poor reproductive or obstetric history, first trimester: Secondary | ICD-10-CM

## 2020-12-16 DIAGNOSIS — O0990 Supervision of high risk pregnancy, unspecified, unspecified trimester: Secondary | ICD-10-CM

## 2020-12-16 DIAGNOSIS — Z3A19 19 weeks gestation of pregnancy: Secondary | ICD-10-CM

## 2020-12-16 DIAGNOSIS — Z8632 Personal history of gestational diabetes: Secondary | ICD-10-CM

## 2020-12-16 NOTE — Progress Notes (Signed)
   PRENATAL VISIT NOTE  Subjective:  Veronica Torres is a 35 y.o. [phone] at [redacted]w[redacted]d being seen today for ongoing prenatal care.  She is currently monitored for the following issues for this high-risk pregnancy and has History of miscarriage; Obesity in pregnancy; Gestational diabetes; Encounter for induction of labor; Supervision of high risk pregnancy, antepartum; History of preterm delivery, currently pregnant; and History of diet controlled gestational diabetes mellitus (GDM) on their problem list.  Patient reports no complaints.  Contractions: Irritability. Vag. Bleeding: None.  Movement: Present. Denies leaking of fluid.   The following portions of the patient's history were reviewed and updated as appropriate: allergies, current medications, past family history, past medical history, past social history, past surgical history and problem list.   Objective:   Vitals:   12/16/20 1149  BP: 110/70  Pulse: 81  Weight: 202 lb 11.2 oz (91.9 kg)    Fetal Status: Fetal Heart Rate (bpm): 152 Fundal Height: 20 cm Movement: Present     General:  Alert, oriented and cooperative. Patient is in no acute distress.  Skin: Skin is warm and dry. No rash noted.   Cardiovascular: Normal heart rate noted  Respiratory: Normal respiratory effort, no problems with respiration noted  Abdomen: Soft, gravid, appropriate for gestational age.  Pain/Pressure: Absent     Pelvic: Cervical exam deferred        Extremities: Normal range of motion.  Edema: None  Mental Status: Normal mood and affect. Normal behavior. Normal judgment and thought content.   Assessment and Plan:  Pregnancy: [id number] at [redacted]w[redacted]d 1. [redacted] weeks gestation of pregnancy - on PNV and baby ASA - follow up 4 week anatomy scan has been scheduled to complete anatomy survey  2. Supervision of high risk pregnancy, antepartum - AFP, Serum, Open Spina Bifida  3. History of diet controlled gestational diabetes mellitus (GDM)  4. Previous child with  Down syndrome in first trimester, antepartum - Declined genetic testing.   5. Prior pregnancy with congenital cardiac defect, antepartum - Has been referred for fetal echo with [facility]  Preterm labor symptoms and general obstetric precautions including but not limited to vaginal bleeding, contractions, leaking of fluid and fetal movement were reviewed in detail with the patient. Please refer to After Visit Summary for other counseling recommendations.   Return in about 4 weeks (around 01/13/2021).  Future Appointments  Date Time Provider Department Center  01/13/2021  3:30 PM [facility] NURSE [facility]  01/13/2021  3:45 PM [facility] US5 [facility]    Jerene Bears, MD

## 2020-12-18 LAB — AFP, SERUM, OPEN SPINA BIFIDA
AFP MoM: 1.4
AFP Value: 65.6 ng/mL
Gest. Age on Collection Date: 19.3 weeks
Maternal Age At EDD: 34.2 yr
OSBR Risk 1 IN: 7201
Test Results:: NEGATIVE
Weight: 207 [lb_av]

## 2021-01-04 ENCOUNTER — Encounter: Payer: Self-pay | Admitting: Pediatric Cardiology

## 2021-01-13 ENCOUNTER — Encounter: Payer: Commercial Managed Care - PPO | Admitting: Medical

## 2021-01-13 ENCOUNTER — Encounter: Payer: Self-pay | Admitting: *Deleted

## 2021-01-13 ENCOUNTER — Ambulatory Visit: Payer: Commercial Managed Care - PPO | Attending: Obstetrics and Gynecology

## 2021-01-13 ENCOUNTER — Other Ambulatory Visit: Payer: Self-pay

## 2021-01-13 ENCOUNTER — Ambulatory Visit: Payer: Commercial Managed Care - PPO | Admitting: *Deleted

## 2021-01-13 DIAGNOSIS — O09292 Supervision of pregnancy with other poor reproductive or obstetric history, second trimester: Secondary | ICD-10-CM | POA: Diagnosis not present

## 2021-01-13 DIAGNOSIS — O09899 Supervision of other high risk pregnancies, unspecified trimester: Secondary | ICD-10-CM | POA: Diagnosis present

## 2021-01-13 DIAGNOSIS — Z363 Encounter for antenatal screening for malformations: Secondary | ICD-10-CM

## 2021-01-13 DIAGNOSIS — Z8632 Personal history of gestational diabetes: Secondary | ICD-10-CM | POA: Insufficient documentation

## 2021-01-13 DIAGNOSIS — R638 Other symptoms and signs concerning food and fluid intake: Secondary | ICD-10-CM | POA: Insufficient documentation

## 2021-01-13 DIAGNOSIS — O99212 Obesity complicating pregnancy, second trimester: Secondary | ICD-10-CM | POA: Diagnosis not present

## 2021-01-13 DIAGNOSIS — O0990 Supervision of high risk pregnancy, unspecified, unspecified trimester: Secondary | ICD-10-CM | POA: Diagnosis present

## 2021-01-13 DIAGNOSIS — O09212 Supervision of pregnancy with history of pre-term labor, second trimester: Secondary | ICD-10-CM

## 2021-01-13 DIAGNOSIS — Z3A23 23 weeks gestation of pregnancy: Secondary | ICD-10-CM

## 2021-01-13 DIAGNOSIS — O34219 Maternal care for unspecified type scar from previous cesarean delivery: Secondary | ICD-10-CM | POA: Diagnosis not present

## 2021-01-16 ENCOUNTER — Other Ambulatory Visit: Payer: Self-pay

## 2021-01-16 ENCOUNTER — Ambulatory Visit (INDEPENDENT_AMBULATORY_CARE_PROVIDER_SITE_OTHER): Payer: Commercial Managed Care - PPO | Admitting: Obstetrics and Gynecology

## 2021-01-16 ENCOUNTER — Other Ambulatory Visit: Payer: Self-pay | Admitting: *Deleted

## 2021-01-16 VITALS — BP 118/62 | HR 92 | Wt 212.7 lb

## 2021-01-16 DIAGNOSIS — O09899 Supervision of other high risk pregnancies, unspecified trimester: Secondary | ICD-10-CM

## 2021-01-16 DIAGNOSIS — Z8632 Personal history of gestational diabetes: Secondary | ICD-10-CM

## 2021-01-16 DIAGNOSIS — Z8279 Family history of other congenital malformations, deformations and chromosomal abnormalities: Secondary | ICD-10-CM | POA: Insufficient documentation

## 2021-01-16 DIAGNOSIS — O99210 Obesity complicating pregnancy, unspecified trimester: Secondary | ICD-10-CM

## 2021-01-16 DIAGNOSIS — O0990 Supervision of high risk pregnancy, unspecified, unspecified trimester: Secondary | ICD-10-CM

## 2021-01-16 DIAGNOSIS — R638 Other symptoms and signs concerning food and fluid intake: Secondary | ICD-10-CM

## 2021-01-16 DIAGNOSIS — Z3A23 23 weeks gestation of pregnancy: Secondary | ICD-10-CM

## 2021-01-16 NOTE — Progress Notes (Signed)
   PRENATAL VISIT NOTE  Subjective:  Veronica Torres is a 35 y.o. [phone] at 100w6d being seen today for ongoing prenatal care.  She is currently monitored for the following issues for this high-risk pregnancy and has History of miscarriage; Obesity in pregnancy; Gestational diabetes; Encounter for induction of labor; Supervision of high risk pregnancy, antepartum; History of preterm delivery, currently pregnant; History of diet controlled gestational diabetes mellitus (GDM); Family history of Downs syndrome; and [redacted] weeks gestation of pregnancy on their problem list.  Patient doing well with no acute concerns today. She reports no complaints.  Contractions: Not present. Vag. Bleeding: None.  Movement: Present. Denies leaking of fluid.   The following portions of the patient's history were reviewed and updated as appropriate: allergies, current medications, past family history, past medical history, past social history, past surgical history and problem list. Problem list updated.  Objective:   Vitals:   01/16/21 1023  BP: 118/62  Pulse: 92  Weight: 212 lb 11.2 oz (96.5 kg)    Fetal Status: Fetal Heart Rate (bpm): 152 Fundal Height: 24 cm Movement: Present     General:  Alert, oriented and cooperative. Patient is in no acute distress.  Skin: Skin is warm and dry. No rash noted.   Cardiovascular: Normal heart rate noted  Respiratory: Normal respiratory effort, no problems with respiration noted  Abdomen: Soft, gravid, appropriate for gestational age.  Pain/Pressure: Absent     Pelvic: Cervical exam deferred        Extremities: Normal range of motion.  Edema: None  Mental Status:  Normal mood and affect. Normal behavior. Normal judgment and thought content.   Assessment and Plan:  Pregnancy: [id number] at [redacted]w[redacted]d  1. Obesity in pregnancy   2. History of preterm delivery, currently pregnant No s/sx or preterm labor  3. History of diet controlled gestational diabetes mellitus (GDM) Glucose  test next visit  4. Family history of Downs syndrome Pt has one child witgh down's syndrome, pt declined testing  5. [redacted] weeks gestation of pregnancy VBAC counseling completed today, form signed.  Preterm labor symptoms and general obstetric precautions including but not limited to vaginal bleeding, contractions, leaking of fluid and fetal movement were reviewed in detail with the patient.  Please refer to After Visit Summary for other counseling recommendations.   Return in about 4 weeks (around 02/13/2021) for [facility], in person, 2 hr GTT, 3rd trim labs.   Mariel Aloe, MD

## 2021-01-16 NOTE — Patient Instructions (Signed)

## 2021-01-16 NOTE — Progress Notes (Signed)
Faculty Practice OB/GYN Attending Consult Note  35 y.o. [phone] at [redacted]w[redacted]d with Estimated Date of Delivery: 05/09/21 was seen today in office to discuss trial of labor after cesarean section (TOLAC) versus elective repeat cesarean delivery (ERCD). The following risks were discussed with the patient.  Risk of uterine rupture at term is 0.78 percent with TOLAC and 0.22 percent with ERCD. 1 in 10 uterine ruptures will result in neonatal death or neurological injury. The benefits of a trial of labor after cesarean (TOLAC) resulting in a vaginal birth after cesarean (VBAC) include the following: shorter length of hospital stay and postpartum recovery (in most cases); fewer complications, such as postpartum fever, wound or uterine infection, thromboembolism (blood clots in the leg or lung), need for blood transfusion and fewer neonatal breathing problems. The risks of an attempted VBAC or TOLAC include the following: . Risk of failed trial of labor after cesarean (TOLAC) without a vaginal birth after cesarean (VBAC) resulting in repeat cesarean delivery (RCD) in about 20 to 40 percent of women who attempt VBAC.  Marland Kitchen Risk of rupture of uterus resulting in an emergency cesarean delivery. The risk of uterine rupture may be related in part to the type of uterine incision made during the first cesarean delivery. A previous transverse uterine incision has the lowest risk of rupture (0.2 to 1.5 percent risk). Vertical or T-shaped uterine incisions have a higher risk of uterine rupture (4 to 9 percent risk)The risk of fetal death is very low with both VBAC and elective repeat cesarean delivery (ERCD), but the likelihood of fetal death is higher with VBAC than with ERCD. Maternal death is very rare with either type of delivery. The risks of an elective repeat cesarean delivery (ERCD) were reviewed with the patient including but not limited to: 01/999 risk of uterine rupture which could have serious consequences, bleeding which  may require transfusion; infection which may require antibiotics; injury to bowel, bladder or other surrounding organs (bowel, bladder, ureters); injury to the fetus; need for additional procedures including hysterectomy in the event of a life-threatening hemorrhage; thromboembolic phenomenon; abnormal placentation; incisional problems; death and other postoperative or anesthesia complications.    These risks and benefits are summarized on the consent form, which was reviewed with the patient during the visit.  All her questions answered and she signed a consent indicating a preference for TOLAC/ERCD. A copy of the consent was given to the patient.   Mariel Aloe, MD, FACOG Obstetrician & Gynecologist, [facility] for [facility], [facility]

## 2021-02-14 ENCOUNTER — Other Ambulatory Visit: Payer: Self-pay | Admitting: Lactation Services

## 2021-02-14 DIAGNOSIS — O0990 Supervision of high risk pregnancy, unspecified, unspecified trimester: Secondary | ICD-10-CM

## 2021-02-15 ENCOUNTER — Ambulatory Visit (INDEPENDENT_AMBULATORY_CARE_PROVIDER_SITE_OTHER): Payer: Commercial Managed Care - PPO | Admitting: Obstetrics & Gynecology

## 2021-02-15 ENCOUNTER — Other Ambulatory Visit: Payer: Self-pay

## 2021-02-15 ENCOUNTER — Other Ambulatory Visit: Payer: Commercial Managed Care - PPO

## 2021-02-15 VITALS — BP 117/74 | HR 90 | Wt 213.7 lb

## 2021-02-15 DIAGNOSIS — O0990 Supervision of high risk pregnancy, unspecified, unspecified trimester: Secondary | ICD-10-CM

## 2021-02-15 DIAGNOSIS — Z8632 Personal history of gestational diabetes: Secondary | ICD-10-CM

## 2021-02-15 DIAGNOSIS — O09899 Supervision of other high risk pregnancies, unspecified trimester: Secondary | ICD-10-CM

## 2021-02-15 NOTE — Patient Instructions (Signed)
Gestational Diabetes Mellitus, Diagnosis Gestational diabetes mellitus is a form of diabetes. It can happen when you are pregnant. The diabetes goes away after you give birth. If you do not get treated for this condition, it may cause problems for you or your baby. What are the causes? This condition is caused by changes in your body when you are pregnant. When these happen:  A part of the body called the pancreas does not make enough insulin.  The body cannot use insulin in the right way. Sugars cannot get into cells in your body. The sugars stay in your blood. This leads to high blood sugar.   What increases the risk?  Being older than age 25 when pregnant.  Having someone with diabetes in your family.  Too much body weight.  Having had this condition in the past.  Polycystic ovary syndrome.  Being pregnant with more than one baby. What are the signs or symptoms?  Being thirsty often.  Being hungry often.  Needing to pee more often. How is this treated?  Eat a healthy diet.  Get more exercise.  Check your blood sugar often.  Take insulin and other medicines, if needed.  Work with an expert on this condition, if told. Follow these instructions at home: Learn about your diabetes Ask your doctor:  How often should I check my blood sugar? Where do I get the equipment?  What medicines do I need? When should I take them?  Do I need to meet with an educator?  Who can I call if I have questions?  Where can I find a support group? General instructions  Take medicines only as told by your doctor.  Stay at a healthy weight.  Drink enough fluid to keep your pee pale yellow.  Wear an alert bracelet or carry a card that shows you have this condition.  Keep all follow-up visits. Where to find more information  American Diabetes Association (ADA): diabetes.org  Association of Diabetes Care & Education Specialists (ADCES): diabeteseducator.org  Centers for  Disease Control and Prevention (CDC): cdc.gov  American Pregnancy Association: americanpregnancy.org  [place] Department of Agriculture MyPlate: myplate.gov Contact a doctor if:  Your blood sugar is at or above 240 mg/dL (13.3 mmol/L).  Your blood sugar is at or above 200 mg/dL (11.1 mmol/L) and you have ketones in your pee.  You have a fever.  You are sick for 2 days or more and you do not get better.  You have either of these problems for more than 6 hours: ? You vomit every time you eat or drink. ? You have watery poop (diarrhea). Get help right away if:  You cannot think clearly.  You are not breathing well.  You have a lot of ketones in your pee.  Your baby seems to move less than normal.  Abnormal fluid or blood starts to come out of your vagina.  You start having contractions before your due date. You may feel your belly tighten.  You have a very bad headache. These symptoms may be an emergency. Get help right away. Call your local emergency services (911 in the [place]  Do not wait to see if the symptoms will go away.  Do not drive yourself to the hospital. Summary  Gestational diabetes is a form of diabetes. It can happen when you are pregnant.  This condition occurs when your body cannot make or use insulin in the right way.  Eat a healthy diet, exercise, and use medicines or insulin   as told by your doctor.  Tell your doctor if your blood sugar is high, you have a fever, or you vomit every time you eat or drink.  Get help right away if you cannot think clearly, you are not breathing well, or your baby seems to move less than normal. This information is not intended to replace advice given to you by your health care provider. Make sure you discuss any questions you have with your health care provider. Document Revised: 05/02/2020 Document Reviewed: 05/02/2020 Elsevier Patient Education  2021 Elsevier Inc.  

## 2021-02-15 NOTE — Progress Notes (Signed)
   PRENATAL VISIT NOTE  Subjective:  Veronica Torres is a 35 y.o. [phone] at [redacted]w[redacted]d being seen today for ongoing prenatal care.  She is currently monitored for the following issues for this high-risk pregnancy and has History of miscarriage; Obesity in pregnancy; Supervision of high risk pregnancy, antepartum; History of preterm delivery, currently pregnant; History of diet controlled gestational diabetes mellitus (GDM); and Family history of Downs syndrome on their problem list.  Patient reports no complaints.  Contractions: Irritability. Vag. Bleeding: None.  Movement: Present. Denies leaking of fluid.   The following portions of the patient's history were reviewed and updated as appropriate: allergies, current medications, past family history, past medical history, past social history, past surgical history and problem list.   Objective:   Vitals:   02/15/21 0830  BP: 117/74  Pulse: 90  Weight: 213 lb 11.2 oz (96.9 kg)    Fetal Status: Fetal Heart Rate (bpm): 147   Movement: Present     General:  Alert, oriented and cooperative. Patient is in no acute distress.  Skin: Skin is warm and dry. No rash noted.   Cardiovascular: Normal heart rate noted  Respiratory: Normal respiratory effort, no problems with respiration noted  Abdomen: Soft, gravid, appropriate for gestational age.  Pain/Pressure: Absent     Pelvic: Cervical exam deferred        Extremities: Normal range of motion.  Edema: None  Mental Status: Normal mood and affect. Normal behavior. Normal judgment and thought content.   Assessment and Plan:  Pregnancy: [id number] at [redacted]w[redacted]d 1. Supervision of high risk pregnancy, antepartum Doing well  2. History of preterm delivery, currently pregnant   3. History of diet controlled gestational diabetes mellitus (GDM) 28 week labs today  Preterm labor symptoms and general obstetric precautions including but not limited to vaginal bleeding, contractions, leaking of fluid and fetal  movement were reviewed in detail with the patient. Please refer to After Visit Summary for other counseling recommendations.   Return in about 2 weeks (around 03/01/2021).  Future Appointments  Date Time Provider Department Center  02/15/2021  8:50 AM [facility]  03/22/2021 10:30 AM [facility] NURSE [facility]  03/22/2021 10:45 AM [facility] US4 [facility]    Scheryl Darter, MD

## 2021-02-16 ENCOUNTER — Other Ambulatory Visit: Payer: Self-pay | Admitting: *Deleted

## 2021-02-16 DIAGNOSIS — O24410 Gestational diabetes mellitus in pregnancy, diet controlled: Secondary | ICD-10-CM

## 2021-02-16 DIAGNOSIS — O0990 Supervision of high risk pregnancy, unspecified, unspecified trimester: Secondary | ICD-10-CM

## 2021-02-16 LAB — RPR: RPR Ser Ql: NONREACTIVE

## 2021-02-16 LAB — CBC
Hematocrit: 30.3 % — ABNORMAL LOW (ref 34.0–46.6)
Hemoglobin: 9.8 g/dL — ABNORMAL LOW (ref 11.1–15.9)
MCH: 26.2 pg — ABNORMAL LOW (ref 26.6–33.0)
MCHC: 32.3 g/dL (ref 31.5–35.7)
MCV: 81 fL (ref 79–97)
Platelets: 215 10*3/uL (ref 150–450)
RBC: 3.74 x10E6/uL — ABNORMAL LOW (ref 3.77–5.28)
RDW: 13.2 % (ref 11.7–15.4)
WBC: 6.2 10*3/uL (ref 3.4–10.8)

## 2021-02-16 LAB — HIV ANTIBODY (ROUTINE TESTING W REFLEX): HIV Screen 4th Generation wRfx: NONREACTIVE

## 2021-02-16 LAB — GLUCOSE TOLERANCE, 2 HOURS W/ 1HR
Glucose, 1 hour: 198 mg/dL — ABNORMAL HIGH (ref 65–179)
Glucose, 2 hour: 234 mg/dL — ABNORMAL HIGH (ref 65–152)
Glucose, Fasting: 93 mg/dL — ABNORMAL HIGH (ref 65–91)

## 2021-02-16 MED ORDER — FERROUS SULFATE 325 (65 FE) MG PO TBEC: 325.0000 mg | DELAYED_RELEASE_TABLET | ORAL | 3 refills | Status: DC

## 2021-02-23 IMAGING — US US MFM OB DETAIL+14 WK
1 series · 13 of 28 positions shown · non-contrast
Comparison: none

[Series 1: us mfm ob detail+14 wk · 13 of 150 slices shown]
[im 6/150]
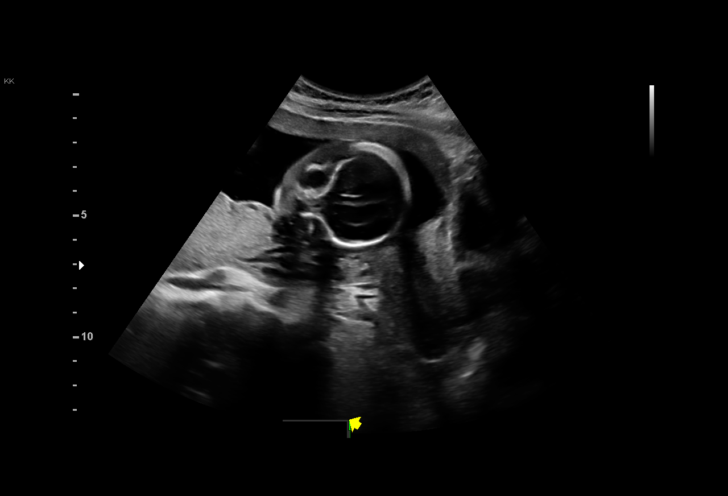
[im 17/150]
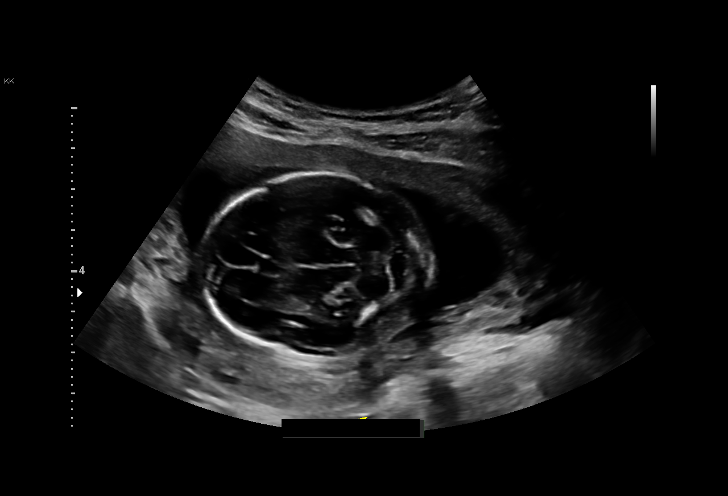
[im 28/150]
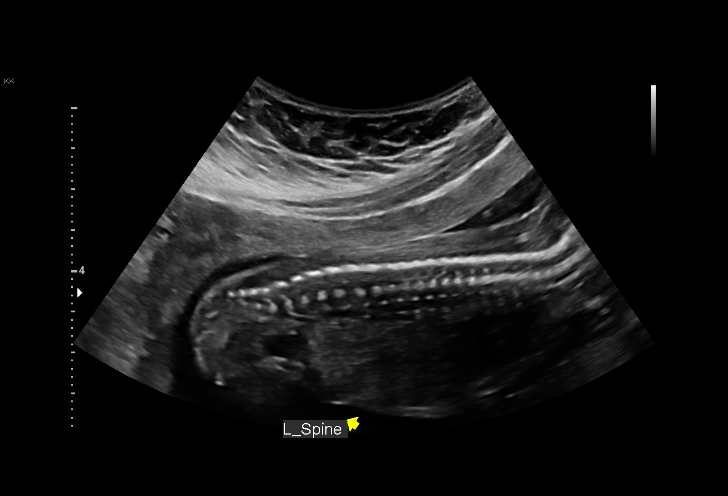
[im 39/150]
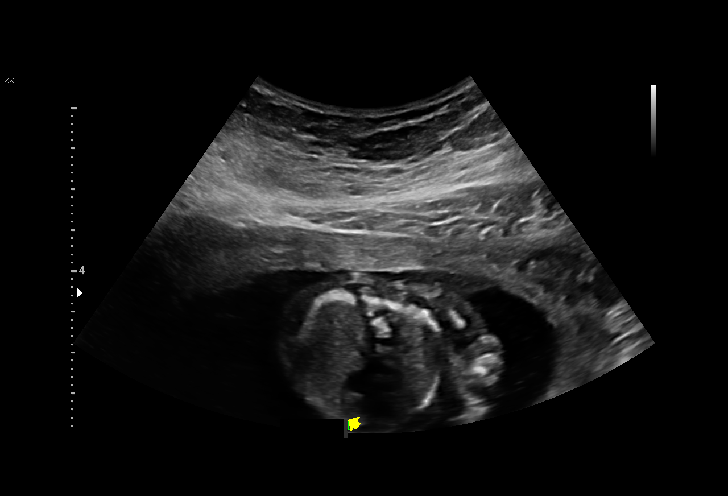
[im 50/150]
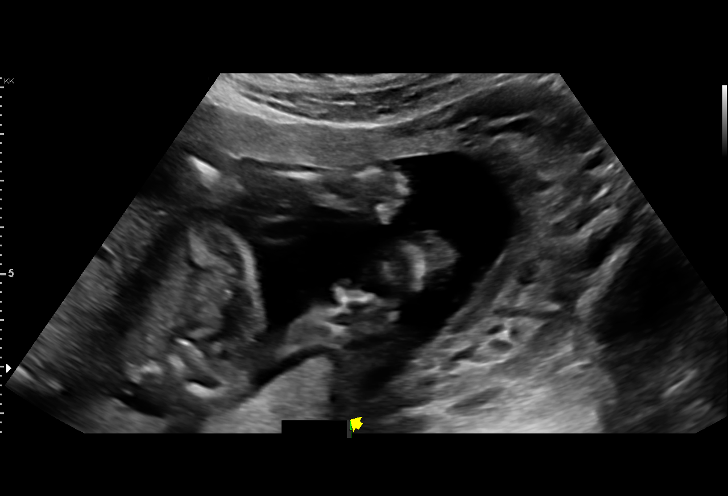
[im 61/150]
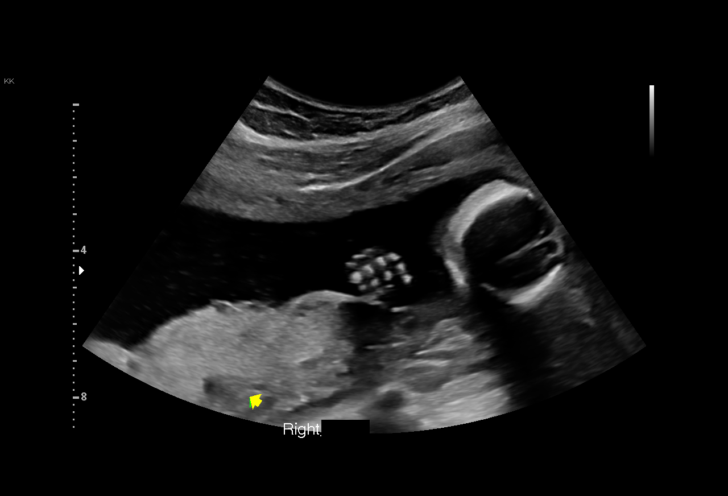
[im 78/150]
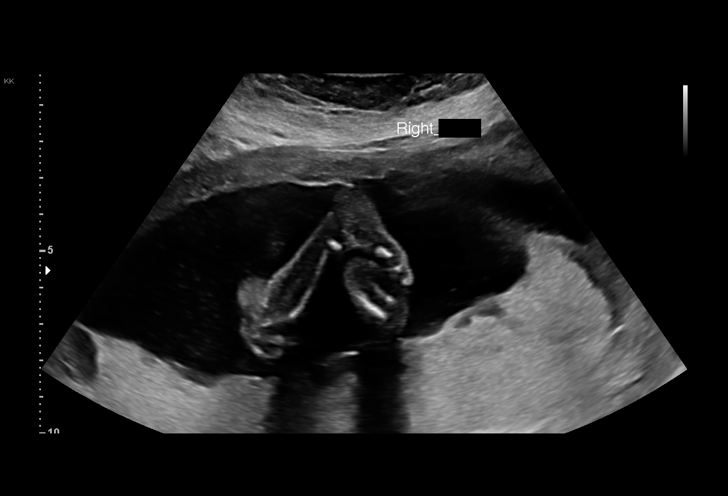
[im 89/150]
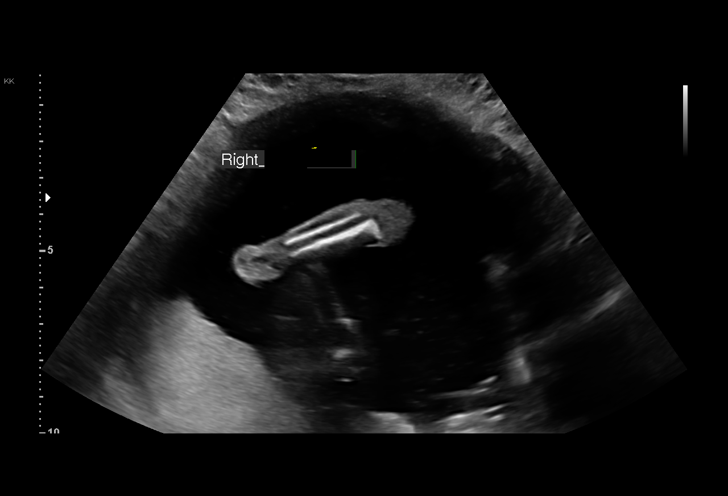
[im 100/150]
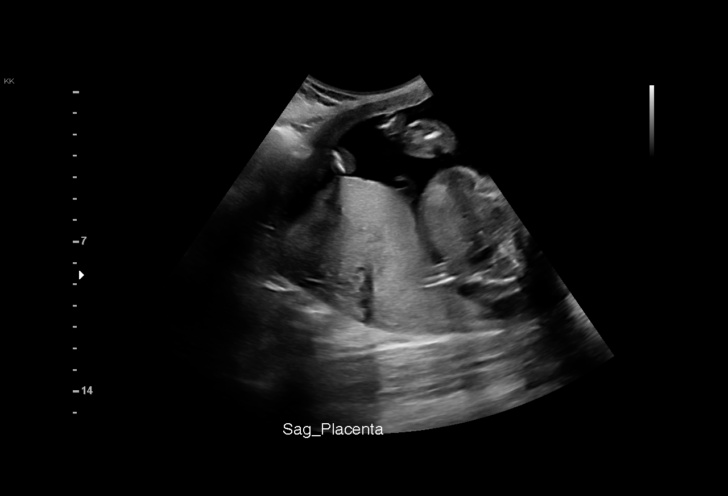
[im 111/150]
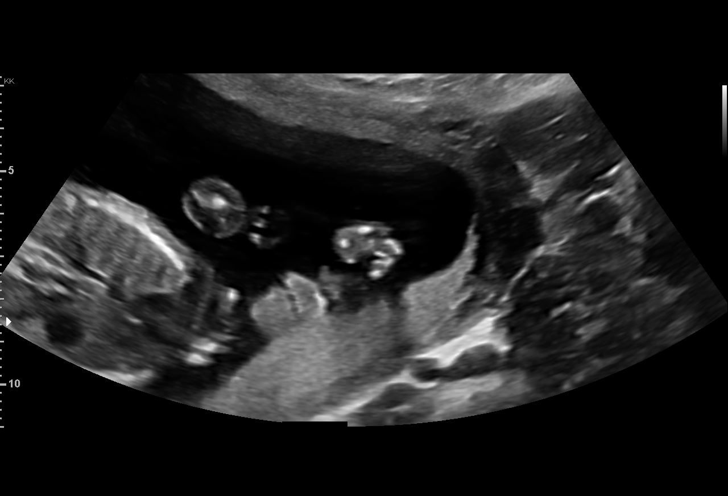
[im 122/150]
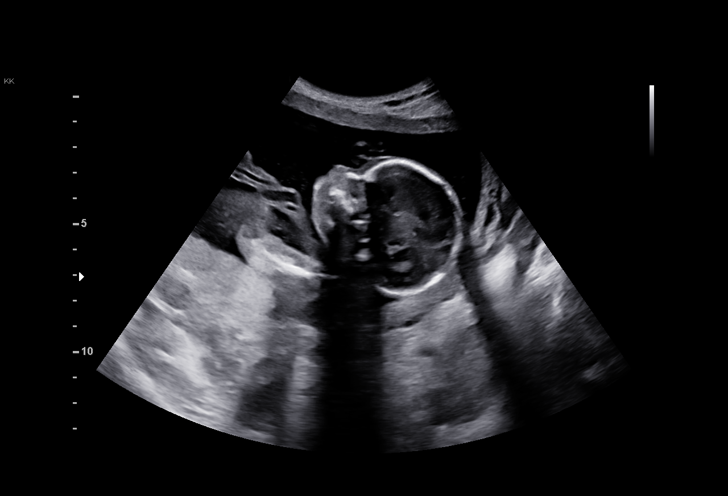
[im 133/150]
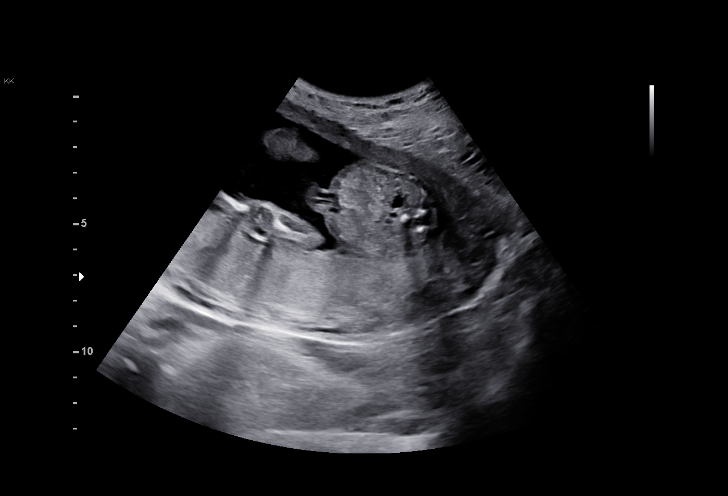
[im 144/150]
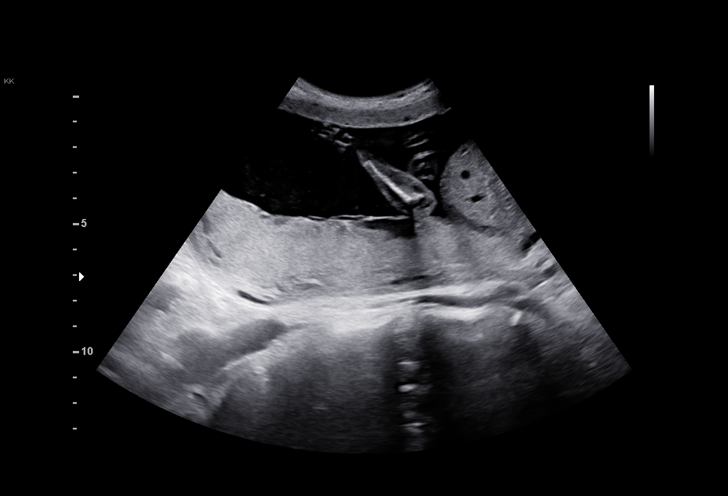

[13 of 28 positions shown; findings below may reference images not displayed]

[facility]                                 [[facility]] at

Indications

 Obesity complicating pregnancy, second
 trimester5Wregravid BMI 36)
 Previous pregnancy complicated by
 chromosomal abnormality, antepartum
 Poor obstetric history: Previous gestational
 diabetes
 Previous cesarean delivery, antepartum x 1
 Encounter for antenatal screening for
 malformations
 Poor obstetric history: Previous preterm
 delivery, antepartum (36 weeks)
 19 weeks gestation of pregnancy
Vital Signs

 BMI:
Fetal Evaluation

 Num Of Fetuses:         1
 Fetal Heart Rate(bpm):  164
 Cardiac Activity:       Observed
 Presentation:           Cephalic
 Placenta:               Posterior
 P. Cord Insertion:      Visualized
 Amniotic Fluid
 AFI FV:      Within normal limits

                             Largest Pocket(cm)

Biometry

 BPD:        45  mm     G. Age:  19w 4d         64  %    CI:        76.34   %    70 - 86
                                                         FL/HC:      18.8   %    16.1 -
 HC:      163.2  mm     G. Age:  19w 1d         32  %    HC/AC:      1.15        1.09 -
 AC:      142.3  mm     G. Age:  19w 4d         56  %    FL/BPD:     68.2   %
 FL:       30.7  mm     G. Age:  19w 4d         51  %    FL/AC:      21.6   %    20 - 24
 HUM:      29.1  mm     G. Age:  19w 3d         56  %
 NFT:       3.2  mm

 Est. FW:     296  gm    0 lb 10 oz      58  %
OB History

 Gravidity:    5         Term:   2        Prem:   1        SAB:   1
 TOP:          0       Ectopic:  0        Living: 3
Gestational Age

 LMP:           20w 4d        Date:  07/24/20                 EDD:   04/30/21
 U/S Today:     19w 3d                                        EDD:   05/08/21
 Best:          19w 2d     Det. By:  Previous Ultrasound      EDD:   05/09/21
                                     (10/25/20)
Anatomy

 Cranium:               Appears normal         Aortic Arch:            Appears normal
 Cavum:                 Appears normal         Ductal Arch:            Appears normal
 Ventricles:            Appears normal         Diaphragm:              Appears normal
 Choroid Plexus:        Appears normal         Stomach:                Appears normal, left
                                                                       sided
 Cerebellum:            Appears normal         Abdomen:                Appears normal
 Posterior Fossa:       Appears normal         Abdominal Wall:         Appears nml (cord
                                                                       insert, abd wall)
 Nuchal Fold:           Appears normal         Cord Vessels:           Appears normal (3
                                                                       vessel cord)
 Face:                  Orbits appear          Kidneys:                Appear normal
                        normal
 Lips:                  Appears normal         Bladder:                Appears normal
 Thoracic:              Appears normal         Spine:                  Appears normal
 Heart:                 Appears normal; EIF    Upper Extremities:      Appears normal
 RVOT:                  Not well visualized    Lower Extremities:      Appears normal
 LVOT:                  Not well visualized

 Other:  Fetus a male. Technically difficult due to fetal position.
Cervix Uterus Adnexa

 Cervix
 Length:           3.38  cm.
Comments

 This patient was seen for a detailed fetal anatomy scan.  She
 reports that her prior child was born with Down syndrome and
 had two VSDs.  The baby had to be transported to [facility] after delivery for ECMO treatment.  She
 reports that her child is doing well today and has not required
 surgical repair for the VSD.
 She denies any other significant past medical history and
 denies any problems in her current pregnancy.
 She has declined all screening tests for fetal aneuploidy in
 her current pregnancy.
 She was informed that the fetal growth and amniotic fluid
 level were appropriate for her gestational age.
 On today's exam, an intracardiac echogenic focus was noted
 in the left ventricle of the fetal heart.  The small association
 between an echogenic focus and Down syndrome was
 discussed. Due to the echogenic focus noted today, the
 patient was offered and declined an amniocentesis today for
 definitive diagnosis of fetal aneuploidy.  She was also offered
 and declined a cell free test for detection of Down syndrome
 today.  She reports that an aneuploid fetus would not change
 the outcome of her current pregnancy.
 The views of the fetal anatomy were limited today due to the
 fetal position.
 The patient was informed that anomalies may be missed due
 to technical limitations. If the fetus is in a suboptimal position
 or maternal habitus is increased, visualization of the fetus in
 the maternal uterus may be impaired.
 Due to her history of a prior child with a congenital heart
 defect, she was referred to [facility] for a
 fetal echocardiogram.
 A follow-up exam was scheduled in 4 weeks to complete the
 views of the fetal anatomy.

## 2021-03-01 ENCOUNTER — Encounter: Payer: Commercial Managed Care - PPO | Admitting: Family Medicine

## 2021-03-02 ENCOUNTER — Other Ambulatory Visit: Payer: Commercial Managed Care - PPO

## 2021-03-08 ENCOUNTER — Other Ambulatory Visit: Payer: Self-pay

## 2021-03-08 ENCOUNTER — Encounter: Payer: Self-pay | Admitting: Obstetrics and Gynecology

## 2021-03-08 ENCOUNTER — Ambulatory Visit (INDEPENDENT_AMBULATORY_CARE_PROVIDER_SITE_OTHER): Payer: Commercial Managed Care - PPO | Admitting: Obstetrics and Gynecology

## 2021-03-08 VITALS — BP 115/74 | HR 90 | Wt 212.9 lb

## 2021-03-08 DIAGNOSIS — Z98891 History of uterine scar from previous surgery: Secondary | ICD-10-CM | POA: Insufficient documentation

## 2021-03-08 DIAGNOSIS — O24419 Gestational diabetes mellitus in pregnancy, unspecified control: Secondary | ICD-10-CM

## 2021-03-08 DIAGNOSIS — O0990 Supervision of high risk pregnancy, unspecified, unspecified trimester: Secondary | ICD-10-CM

## 2021-03-08 DIAGNOSIS — O368130 Decreased fetal movements, third trimester, not applicable or unspecified: Secondary | ICD-10-CM

## 2021-03-08 DIAGNOSIS — O24410 Gestational diabetes mellitus in pregnancy, diet controlled: Secondary | ICD-10-CM

## 2021-03-08 DIAGNOSIS — O09899 Supervision of other high risk pregnancies, unspecified trimester: Secondary | ICD-10-CM

## 2021-03-08 DIAGNOSIS — Z8279 Family history of other congenital malformations, deformations and chromosomal abnormalities: Secondary | ICD-10-CM

## 2021-03-08 HISTORY — DX: Gestational diabetes mellitus in pregnancy, unspecified control: O24.419

## 2021-03-08 MED ORDER — GLUCOSE BLOOD VI STRP: ORAL_STRIP | 12 refills | Status: DC

## 2021-03-08 MED ORDER — ONETOUCH ULTRASOFT LANCETS MISC: 12 refills | Status: DC

## 2021-03-08 NOTE — Addendum Note (Signed)
Addended by: Jill Side on: 03/08/2021 04:06 PM   Modules accepted: Orders

## 2021-03-08 NOTE — Progress Notes (Signed)
Subjective:  Veronica Torres is a 35 y.o. [phone] at [redacted]w[redacted]d being seen today for ongoing prenatal care.  She is currently monitored for the following issues for this high-risk pregnancy and has History of miscarriage; Obesity in pregnancy; Supervision of high risk pregnancy, antepartum; History of preterm delivery, currently pregnant; History of diet controlled gestational diabetes mellitus (GDM); Family history of Downs syndrome; H/O: cesarean section; and Gestational diabetes on their problem list.  Patient reports decreased fetal movement for the last week and half. Denies LOF, VB or ut ctx..  Contractions: Not present. Vag. Bleeding: None.  Movement: (!) Decreased. Denies leaking of fluid.   The following portions of the patient's history were reviewed and updated as appropriate: allergies, current medications, past family history, past medical history, past social history, past surgical history and problem list. Problem list updated.  Objective:   Vitals:   03/08/21 1445  BP: 115/74  Pulse: 90  Weight: 212 lb 14.4 oz (96.6 kg)    Fetal Status: Fetal Heart Rate (bpm): 148   Movement: (!) Decreased     General:  Alert, oriented and cooperative. Patient is in no acute distress.  Skin: Skin is warm and dry. No rash noted.   Cardiovascular: Normal heart rate noted  Respiratory: Normal respiratory effort, no problems with respiration noted  Abdomen: Soft, gravid, appropriate for gestational age. Pain/Pressure: Present     Pelvic:  Cervical exam deferred        Extremities: Normal range of motion.  Edema: None  Mental Status: Normal mood and affect. Normal behavior. Normal judgment and thought content.   Urinalysis:      Assessment and Plan:  Pregnancy: [id number] at [redacted]w[redacted]d  1. Supervision of high risk pregnancy, antepartum Stable BPP/NST for decreased fetal movement today  2. Family history of Downs syndrome Declined genetic testing Normal fetal ECHO  3. History of preterm delivery,  currently pregnant No evidence with this pregnancy thus far  4. H/O: cesarean section Desires TOLAC, consent signed  5. Diet controlled gestational diabetes mellitus (GDM) in third trimester GDM reviewed with pt Pt had with last pregnancy, diet control. Importance of CBG control for maternal/fetal risks reduction discussed Pt still has glucometer from last pregnancy. Advised to start checking CBG's. CBG goals reviewed Paper log provided to pt as well Strips and Lacents to pharmacy Growth scan next month  Preterm labor symptoms and general obstetric precautions including but not limited to vaginal bleeding, contractions, leaking of fluid and fetal movement were reviewed in detail with the patient. Please refer to After Visit Summary for other counseling recommendations.  Return in about 2 weeks (around 03/22/2021) for OB visit, face to face, MD only.   Hermina Staggers, MD

## 2021-03-08 NOTE — Patient Instructions (Signed)
Gestational Diabetes Mellitus, Diagnosis Gestational diabetes mellitus is a form of diabetes. It can happen when you are pregnant. The diabetes goes away after you give birth. If you do not get treated for this condition, it may cause problems for you or your baby. What are the causes? This condition is caused by changes in your body when you are pregnant. When these happen:  A part of the body called the pancreas does not make enough insulin.  The body cannot use insulin in the right way. Sugars cannot get into cells in your body. The sugars stay in your blood. This leads to high blood sugar.   What increases the risk?  Being older than age 15 when pregnant.  Having someone with diabetes in your family.  Too much body weight.  Having had this condition in the past.  Polycystic ovary syndrome.  Being pregnant with more than one baby. What are the signs or symptoms?  Being thirsty often.  Being hungry often.  Needing to pee more often. How is this treated?  Eat a healthy diet.  Get more exercise.  Check your blood sugar often.  Take insulin and other medicines, if needed.  Work with an Cytogeneticist on this condition, if told. Follow these instructions at home: Learn about your diabetes Ask your doctor:  How often should I check my blood sugar? Where do I get the equipment?  What medicines do I need? When should I take them?  Do I need to meet with an educator?  Who can I call if I have questions?  Where can I find a support group? General instructions  Take medicines only as told by your doctor.  Stay at a healthy weight.  Drink enough fluid to keep your pee pale yellow.  Wear an alert bracelet or carry a card that shows you have this condition.  Keep all follow-up visits. Where to find more information  American Diabetes Association (ADA): diabetes.org  Association of Diabetes Care & Education Specialists (ADCES): diabeteseducator.org  Centers for  Disease Control and Prevention (CDC): StoreMirror.com.cy  American Pregnancy Association: americanpregnancy.org  U.[place]. Department of Agriculture MyPlate: http://www.wilson-mendoza.org/ Contact a doctor if:  Your blood sugar is at or above 240 mg/dL (13.3 mmol/L).  Your blood sugar is at or above 200 mg/dL (11.1 mmol/L) and you have ketones in your pee.  You have a fever.  You are sick for 2 days or more and you do not get better.  You have either of these problems for more than 6 hours: ? You vomit every time you eat or drink. ? You have watery poop (diarrhea). Get help right away if:  You cannot think clearly.  You are not breathing well.  You have a lot of ketones in your pee.  Your baby seems to move less than normal.  Abnormal fluid or blood starts to come out of your vagina.  You start having contractions before your due date. You may feel your belly tighten.  You have a very bad headache. These symptoms may be an emergency. Get help right away. Call your local emergency services (911 in the [place]  Do not wait to see if the symptoms will go away.  Do not drive yourself to the hospital. Summary  Gestational diabetes is a form of diabetes. It can happen when you are pregnant.  This condition occurs when your body cannot make or use insulin in the right way.  Eat a healthy diet, exercise, and use medicines or insulin  as told by your doctor.  Tell your doctor if your blood sugar is high, you have a fever, or you vomit every time you eat or drink.  Get help right away if you cannot think clearly, you are not breathing well, or your baby seems to move less than normal. This information is not intended to replace advice given to you by your health care provider. Make sure you discuss any questions you have with your health care provider. Document Revised: 05/02/2020 Document Reviewed: 05/02/2020 Elsevier Patient Education  2021 Elsevier  Inc. [place].cz.aspx">  Third Trimester of Pregnancy  The third trimester of pregnancy is from week 28 through week 40. This is months 7 through 9. The third trimester is a time when the unborn baby (fetus) is growing rapidly. At the end of the ninth month, the fetus is about 20 inches long and weighs 6-10 pounds. Body changes during your third trimester During the third trimester, your body will continue to go through many changes. The changes vary and generally return to normal after your baby is born. Physical changes  Your weight will continue to increase. You can expect to gain 25-35 pounds (11-16 kg) by the end of the pregnancy if you begin pregnancy at a normal weight. If you are underweight, you can expect to gain 28-40 lb (about 13-18 kg), and if you are overweight, you can expect to gain 15-25 lb (about 7-11 kg).  You may begin to get stretch marks on your hips, abdomen, and breasts.  Your breasts will continue to grow and may hurt. A yellow fluid (colostrum) may leak from your breasts. This is the first milk you are producing for your baby.  You may have changes in your hair. These can include thickening of your hair, rapid growth, and changes in texture. Some people also have hair loss during or after pregnancy, or hair that feels dry or thin.  Your belly button may stick out.  You may notice more swelling in your hands, face, or ankles. Health changes  You may have heartburn.  You may have constipation.  You may develop hemorrhoids.  You may develop swollen, bulging veins (varicose veins) in your legs.  You may have increased body aches in the pelvis, back, or thighs. This is due to weight gain and increased hormones that are relaxing your joints.  You may have increased tingling or numbness in your hands, arms, and legs. The skin on your abdomen may also feel numb.  You may feel short of breath because of your  expanding uterus. Other changes  You may urinate more often because the fetus is moving lower into your pelvis and pressing on your bladder.  You may have more problems sleeping. This may be caused by the size of your abdomen, an increased need to urinate, and an increase in your body's metabolism.  You may notice the fetus "dropping," or moving lower in your abdomen (lightening).  You may have increased vaginal discharge.  You may notice that you have pain around your pelvic bone as your uterus distends. Follow these instructions at home: Medicines  Follow your health care provider's instructions regarding medicine use. Specific medicines may be either safe or unsafe to take during pregnancy. Do not take any medicines unless approved by your health care provider.  Take a prenatal vitamin that contains at least 600 micrograms (mcg) of folic acid. Eating and drinking  Eat a healthy diet that includes fresh fruits and vegetables, whole grains, good sources of  protein such as meat, eggs, or tofu, and low-fat dairy products.  Avoid raw meat and unpasteurized juice, milk, and cheese. These carry germs that can harm you and your baby.  Eat 4 or 5 small meals rather than 3 large meals a day.  You may need to take these actions to prevent or treat constipation: ? Drink enough fluid to keep your urine pale yellow. ? Eat foods that are high in fiber, such as beans, whole grains, and fresh fruits and vegetables. ? Limit foods that are high in fat and processed sugars, such as fried or sweet foods. Activity  Exercise only as directed by your health care provider. Most people can continue their usual exercise routine during pregnancy. Try to exercise for 30 minutes at least 5 days a week. Stop exercising if you experience contractions in the uterus.  Stop exercising if you develop pain or cramping in the lower abdomen or lower back.  Avoid heavy lifting.  Do not exercise if it is very hot or  humid or if you are at a high altitude.  If you choose to, you may continue to have sex unless your health care provider tells you not to. Relieving pain and discomfort  Take frequent breaks and rest with your legs raised (elevated) if you have leg cramps or low back pain.  Take warm sitz baths to soothe any pain or discomfort caused by hemorrhoids. Use hemorrhoid cream if your health care provider approves.  Wear a supportive bra to prevent discomfort from breast tenderness.  If you develop varicose veins: ? Wear support hose as told by your health care provider. ? Elevate your feet for 15 minutes, 3-4 times a day. ? Limit salt in your diet. Safety  Talk to your health care provider before traveling far distances.  Do not use hot tubs, steam rooms, or saunas.  Wear your seat belt at all times when driving or riding in a car.  Talk with your health care provider if someone is verbally or physically abusive to you. Preparing for birth To prepare for the arrival of your baby:  Take prenatal classes to understand, practice, and ask questions about labor and delivery.  Visit the hospital and tour the maternity area.  Purchase a rear-facing car seat and make sure you know how to install it in your car.  Prepare the baby's room or sleeping area. Make sure to remove all pillows and stuffed animals from the baby's crib to prevent suffocation. General instructions  Avoid cat litter boxes and soil used by cats. These carry germs that can cause birth defects in the baby. If you have a cat, ask someone to clean the litter box for you.  Do not douche or use tampons. Do not use scented sanitary pads.  Do not use any products that contain nicotine or tobacco, such as cigarettes, e-cigarettes, and chewing tobacco. If you need help quitting, ask your health care provider.  Do not use any herbal remedies, illegal drugs, or medicines that were not prescribed to you. Chemicals in these products  can harm your baby.  Do not drink alcohol.  You will have more frequent prenatal exams during the third trimester. During a routine prenatal visit, your health care provider will do a physical exam, perform tests, and discuss your overall health. Keep all follow-up visits. This is important. Where to find more information  American Pregnancy Association: americanpregnancy.org  [employer] of Obstetricians and Gynecologists: https://www.todd-brady.net/  Office on [employer] Health: MightyReward.co.nz Contact  a health care provider if you have:  A fever.  Mild pelvic cramps, pelvic pressure, or nagging pain in your abdominal area or lower back.  Vomiting or diarrhea.  Bad-smelling vaginal discharge or foul-smelling urine.  Pain when you urinate.  A headache that does not go away when you take medicine.  Visual changes or see spots in front of your eyes. Get help right away if:  Your water breaks.  You have regular contractions less than 5 minutes apart.  You have spotting or bleeding from your vagina.  You have severe abdominal pain.  You have difficulty breathing.  You have chest pain.  You have fainting spells.  You have not felt your baby move for the time period told by your health care provider.  You have new or increased pain, swelling, or redness in an arm or leg. Summary  The third trimester of pregnancy is from week 28 through week 40 (months 7 through 9).  You may have more problems sleeping. This can be caused by the size of your abdomen, an increased need to urinate, and an increase in your body's metabolism.  You will have more frequent prenatal exams during the third trimester. Keep all follow-up visits. This is important. This information is not intended to replace advice given to you by your health care provider. Make sure you discuss any questions you have with your health care provider. Document Revised: 05/04/2020 Document  Reviewed: 03/10/2020 Elsevier Patient Education  2021 [employer].

## 2021-03-22 ENCOUNTER — Ambulatory Visit: Payer: Commercial Managed Care - PPO | Attending: Maternal & Fetal Medicine

## 2021-03-22 ENCOUNTER — Ambulatory Visit: Payer: Commercial Managed Care - PPO | Admitting: *Deleted

## 2021-03-22 ENCOUNTER — Encounter: Payer: Self-pay | Admitting: *Deleted

## 2021-03-22 ENCOUNTER — Other Ambulatory Visit: Payer: Self-pay

## 2021-03-22 DIAGNOSIS — R638 Other symptoms and signs concerning food and fluid intake: Secondary | ICD-10-CM | POA: Diagnosis present

## 2021-03-22 DIAGNOSIS — E669 Obesity, unspecified: Secondary | ICD-10-CM | POA: Diagnosis not present

## 2021-03-22 DIAGNOSIS — Z363 Encounter for antenatal screening for malformations: Secondary | ICD-10-CM

## 2021-03-22 DIAGNOSIS — O99213 Obesity complicating pregnancy, third trimester: Secondary | ICD-10-CM

## 2021-03-22 DIAGNOSIS — O0990 Supervision of high risk pregnancy, unspecified, unspecified trimester: Secondary | ICD-10-CM | POA: Diagnosis present

## 2021-03-22 DIAGNOSIS — O09899 Supervision of other high risk pregnancies, unspecified trimester: Secondary | ICD-10-CM | POA: Insufficient documentation

## 2021-03-22 DIAGNOSIS — O09293 Supervision of pregnancy with other poor reproductive or obstetric history, third trimester: Secondary | ICD-10-CM

## 2021-03-22 DIAGNOSIS — Z8632 Personal history of gestational diabetes: Secondary | ICD-10-CM | POA: Diagnosis present

## 2021-03-22 DIAGNOSIS — O34219 Maternal care for unspecified type scar from previous cesarean delivery: Secondary | ICD-10-CM

## 2021-03-22 DIAGNOSIS — Z3A33 33 weeks gestation of pregnancy: Secondary | ICD-10-CM

## 2021-03-23 ENCOUNTER — Encounter: Payer: Commercial Managed Care - PPO | Admitting: Family Medicine

## 2021-03-23 ENCOUNTER — Encounter: Payer: Self-pay | Admitting: Family Medicine

## 2021-03-23 ENCOUNTER — Other Ambulatory Visit: Payer: Commercial Managed Care - PPO

## 2021-03-23 NOTE — Progress Notes (Signed)
Patient did not keep appointment today. She will be called to reschedule.  

## 2021-03-24 IMAGING — US US MFM OB FOLLOW-UP
1 series · 13 of 28 positions shown · non-contrast
Comparison: none

[Series 1: us mfm ob follow-up · 86 acquisitions, 13 frames shown]
[im 4/86]
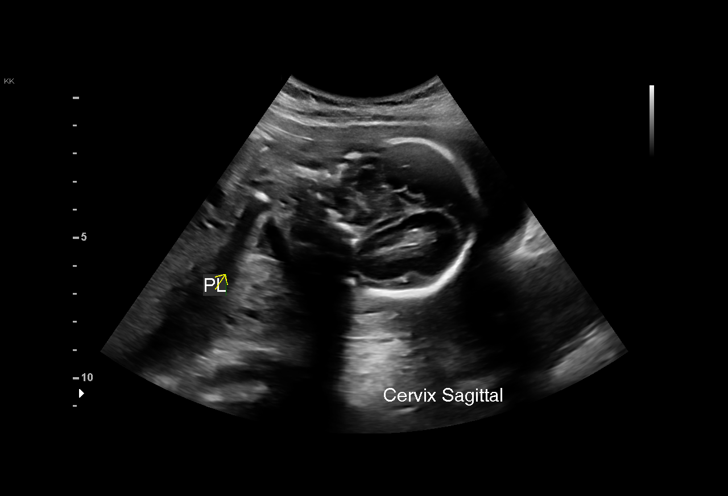
[im 10/86]
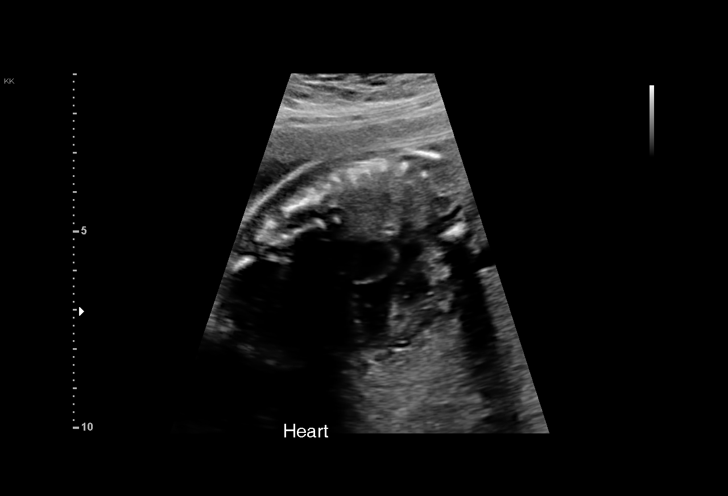
[im 16/86]
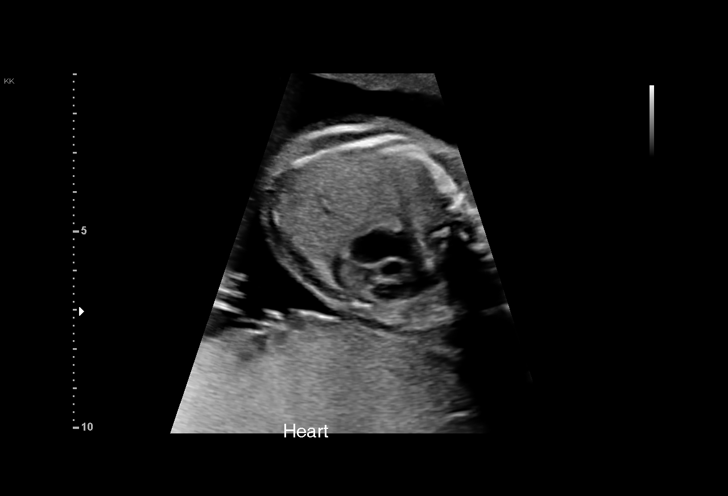
[im 23/86]
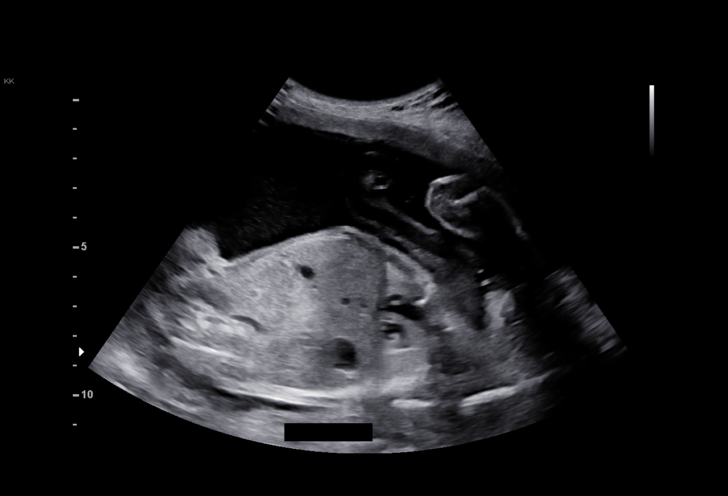
[im 29/86]
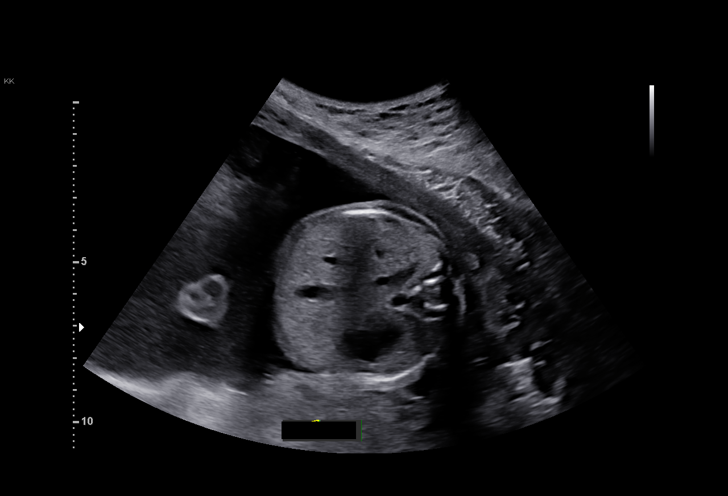
[im 35/86]
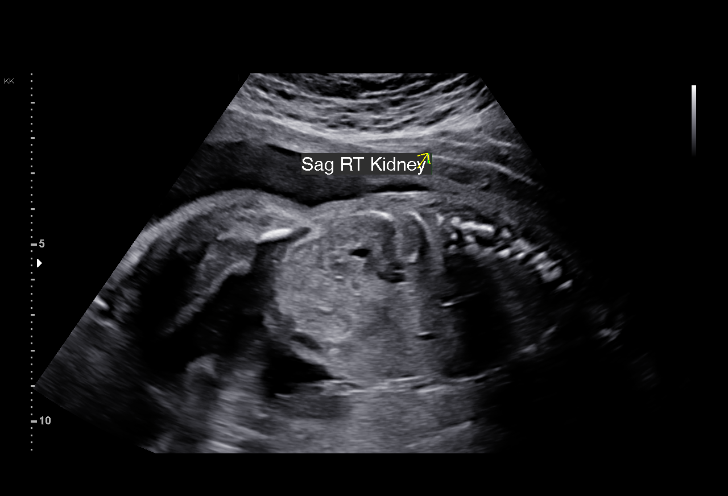
[im 45/86]
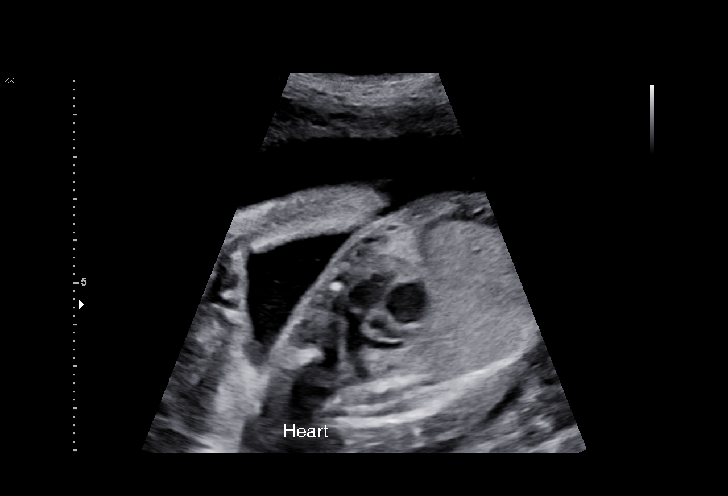
[im 51/86]
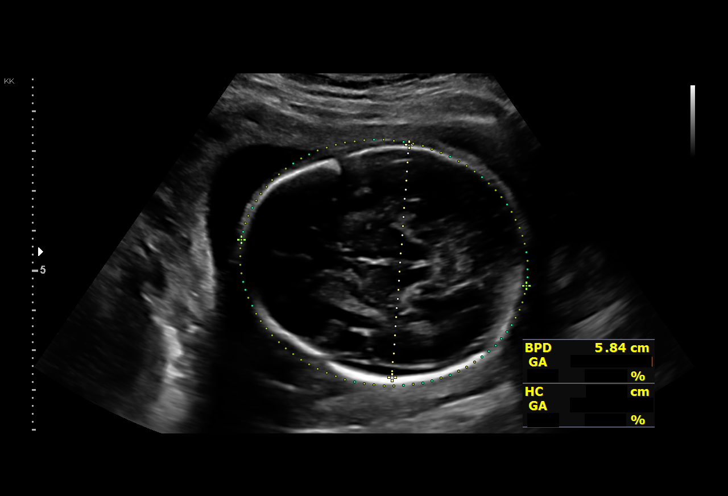
[im 57/86]
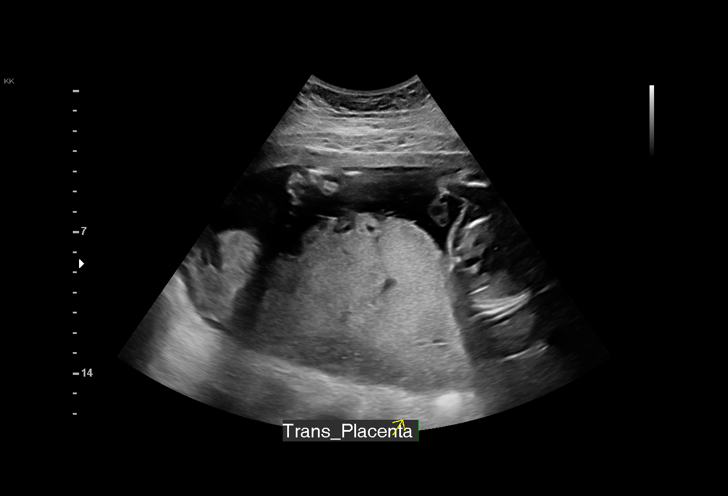
[im 63/86]
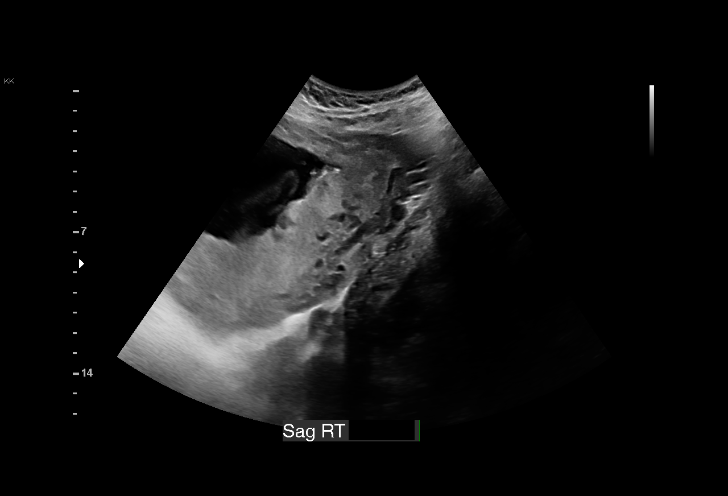
[im 70/86]
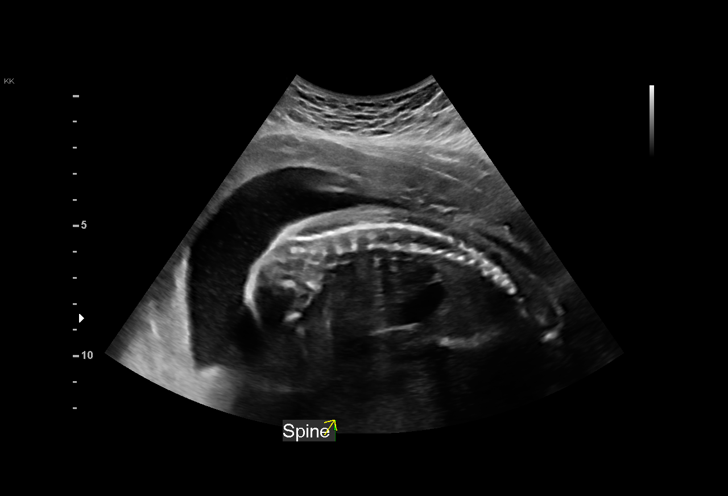
[im 76/86]
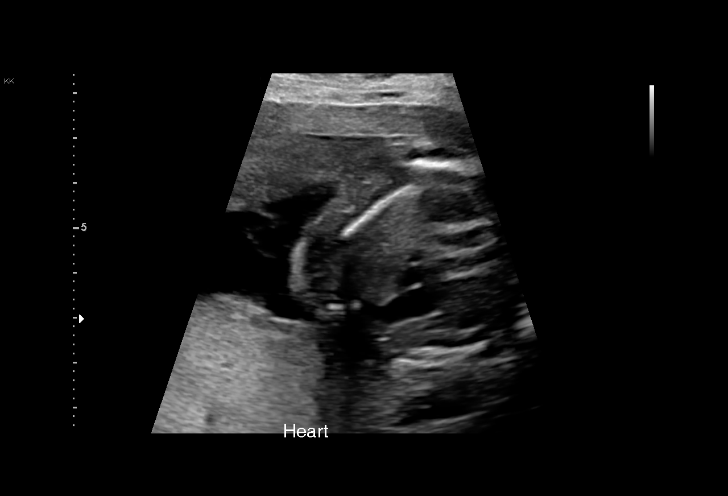
[im 82/86]
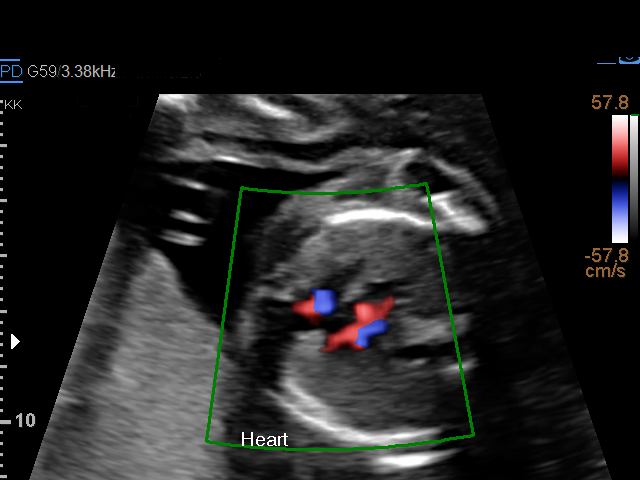

[13 of 28 positions shown; findings below may reference images not displayed]

[facility]                                 [HOSPITAL] at

Indications

 Obesity complicating pregnancy, second
 trimesterE4regravid BMI 36)
 Previous pregnancy complicated by
 chromosomal abnormality, antepartum
 Poor obstetric history: Previous gestational
 diabetes
 Previous cesarean delivery, antepartum x 1
 Encounter for antenatal screening for
 malformations
 Poor obstetric history: Previous preterm
 delivery, antepartum (36 weeks)
 23 weeks gestation of pregnancy
 Fetal ECHO Normal(Negative AFP)
Vital Signs

                                                Height:        5'4"
Fetal Evaluation

 Num Of Fetuses:         1
 Fetal Heart Rate(bpm):  166
 Cardiac Activity:       Observed
 Presentation:           Cephalic
 Placenta:               Posterior
 P. Cord Insertion:      Previously Visualized
 Amniotic Fluid
 AFI FV:      Within normal limits

                             Largest Pocket(cm)

Biometry

 BPD:      58.5  mm     G. Age:  24w 0d         64  %    CI:        77.08   %    70 - 86
                                                         FL/HC:      19.5   %    19.2 -
 HC:       211   mm     G. Age:  23w 1d         24  %    HC/AC:      1.09        1.05 -
 AC:      193.8  mm     G. Age:  24w 1d         63  %    FL/BPD:     70.3   %    71 - 87
 FL:       41.1  mm     G. Age:  23w 2d         35  %    FL/AC:      21.2   %    20 - 24

 Est. FW:     621  gm      1 lb 6 oz     56  %
OB History

 Gravidity:    5         Term:   2        Prem:   1        SAB:   1
 TOP:          0       Ectopic:  0        Living: 3
Gestational Age

 LMP:           24w 5d        Date:  07/24/20                 EDD:   04/30/21
 U/S Today:     23w 5d                                        EDD:   05/07/21
 Best:          23w 3d     Det. By:  Previous Ultrasound      EDD:   05/09/21
                                     (10/25/20)
Anatomy

 Cranium:               Appears normal         Aortic Arch:            Previously seen
 Cavum:                 Appears normal         Ductal Arch:            Appears normal
 Ventricles:            Appears normal         Diaphragm:              Appears normal
 Choroid Plexus:        Previously seen        Stomach:                Appears normal, left
                                                                       sided
 Cerebellum:            Appears normal         Abdomen:                Appears normal
 Posterior Fossa:       Previously seen        Abdominal Wall:         Appears nml (cord
                                                                       insert, abd wall)
 Nuchal Fold:           Previously seen        Cord Vessels:           Previously seen
 Face:                  Appears normal         Kidneys:                Appear normal
                        (orbits and profile)
 Lips:                  Previously seen        Bladder:                Appears normal
 Thoracic:              Appears normal         Spine:                  Previously seen
 Heart:                 Appears normal; EIF    Upper Extremities:      Previously seen
 RVOT:                  Appears normal         Lower Extremities:      Previously seen
 LVOT:                  Appears normal

 Other:  Fetus a male. Technically difficult due to fetal position.
Cervix Uterus Adnexa

 Cervix
 Length:           3.13  cm.
 Normal appearance by transabdominal scan.

 Adnexa
 No abnormality visualized.
Impression

 Follow up growth due to incomplete anatomy seen previously
 and elevated BMI of 36
 Normal interval growth with measurements consistent with
 dates
 Good fetal movement and amniotic fluid volume
 Anatomy is complete
Recommendations

 Follow up growth in 10 week.

## 2021-03-27 ENCOUNTER — Other Ambulatory Visit: Payer: Self-pay | Admitting: General Practice

## 2021-03-27 DIAGNOSIS — O24410 Gestational diabetes mellitus in pregnancy, diet controlled: Secondary | ICD-10-CM

## 2021-03-27 DIAGNOSIS — O0990 Supervision of high risk pregnancy, unspecified, unspecified trimester: Secondary | ICD-10-CM

## 2021-03-27 MED ORDER — GLUCOSE BLOOD VI STRP: ORAL_STRIP | 12 refills | Status: DC

## 2021-03-27 MED ORDER — ONETOUCH ULTRASOFT LANCETS MISC: 12 refills | Status: DC

## 2021-04-05 ENCOUNTER — Other Ambulatory Visit: Payer: Self-pay

## 2021-04-05 ENCOUNTER — Ambulatory Visit (INDEPENDENT_AMBULATORY_CARE_PROVIDER_SITE_OTHER): Payer: Commercial Managed Care - PPO | Admitting: Certified Nurse Midwife

## 2021-04-05 VITALS — BP 105/73 | HR 106 | Wt 212.4 lb

## 2021-04-05 DIAGNOSIS — Z3A35 35 weeks gestation of pregnancy: Secondary | ICD-10-CM

## 2021-04-05 DIAGNOSIS — O0993 Supervision of high risk pregnancy, unspecified, third trimester: Secondary | ICD-10-CM

## 2021-04-05 DIAGNOSIS — O24410 Gestational diabetes mellitus in pregnancy, diet controlled: Secondary | ICD-10-CM

## 2021-04-05 NOTE — Progress Notes (Signed)
Patient states that she is having pains, similar to contractions, happens every other day and comes every 10 minutes that lasts 45 seconds to 1 minutes.. in a 1-2 hours time spand

## 2021-04-05 NOTE — Patient Instructions (Signed)

## 2021-04-06 NOTE — Progress Notes (Signed)
   PRENATAL VISIT NOTE  Subjective:  Veronica Torres is a 35 y.o. [phone] at [redacted]w[redacted]d being seen today for ongoing prenatal care.  She is currently monitored for the following issues for this high-risk pregnancy and has History of miscarriage; Obesity in pregnancy; Supervision of high risk pregnancy, antepartum; History of preterm delivery, currently pregnant; History of diet controlled gestational diabetes mellitus (GDM); Family history of Downs syndrome; H/O: cesarean section; and Gestational diabetes on their problem list.  Patient reports occasional contractions.  Contractions: Irritability. Vag. Bleeding: None.  Movement: Present. Denies leaking of fluid.   The following portions of the patient's history were reviewed and updated as appropriate: allergies, current medications, past family history, past medical history, past social history, past surgical history and problem list.   Objective:   Vitals:   04/05/21 1616  BP: 105/73  Pulse: (!) 106  Weight: 212 lb 6.4 oz (96.3 kg)    Fetal Status: Fetal Heart Rate (bpm): 145 Fundal Height: 35 cm Movement: Present     General:  Alert, oriented and cooperative. Patient is in no acute distress.  Skin: Skin is warm and dry. No rash noted.   Cardiovascular: Normal heart rate noted  Respiratory: Normal respiratory effort, no problems with respiration noted  Abdomen: Soft, gravid, appropriate for gestational age.  Pain/Pressure: Present     Pelvic: Cervical exam deferred        Extremities: Normal range of motion.  Edema: None  Mental Status: Normal mood and affect. Normal behavior. Normal judgment and thought content.   Assessment and Plan:  Pregnancy: [id number] at [redacted]w[redacted]d 1. Supervision of high risk pregnancy in third trimester - Doing well, no physical complaints but very nervous about the delivery. Had a traumatic stat CS during IOL with last labor. Signed TOLAC consent in February but having second thoughts as she gets closer to labor. -  Reviewed risks/benefits of TOLAC with patient and helped her explore which option produces more anxiety (scheduled Cesarean).  - Recommended doula support; pt qualifies for no-cost doula care. Will add to list.  2. [redacted] weeks gestation of pregnancy - Routine OB care  - Anticipatory guidance re GBS test at next visit  3. Diet controlled gestational diabetes mellitus (GDM) in third trimester - Not testing glucose because lancets ordered do not match glucometer. Will get in with diabetic education asap. Emphasized importance of regular testings.  - Pt has adjusted diet the way she did to control glucose in last pregnancy, states she is being careful about her eating.   Preterm labor symptoms and general obstetric precautions including but not limited to vaginal bleeding, contractions, leaking of fluid and fetal movement were reviewed in detail with the patient. Please refer to After Visit Summary for other counseling recommendations.   Return in about 1 week (around 04/12/2021) for IN-PERSON, HROB w GBS.  Future Appointments  Date Time Provider Department Center  04/11/2021  2:15 PM [facility]  04/12/2021  2:55 PM Bernerd Limbo, CNM [facility]    Bernerd Limbo, CNM

## 2021-04-11 ENCOUNTER — Other Ambulatory Visit: Payer: Commercial Managed Care - PPO

## 2021-04-12 ENCOUNTER — Ambulatory Visit (INDEPENDENT_AMBULATORY_CARE_PROVIDER_SITE_OTHER): Payer: Commercial Managed Care - PPO | Admitting: Certified Nurse Midwife

## 2021-04-12 ENCOUNTER — Other Ambulatory Visit (HOSPITAL_COMMUNITY)
Admission: RE | Admit: 2021-04-12 | Discharge: 2021-04-12 | Disposition: A | Discharge status: Home / Self Care | Payer: Commercial Managed Care - PPO | Source: Ambulatory Visit | Attending: Certified Nurse Midwife | Admitting: Certified Nurse Midwife

## 2021-04-12 VITALS — BP 123/69 | HR 93 | Wt 217.1 lb

## 2021-04-12 DIAGNOSIS — Z3A36 36 weeks gestation of pregnancy: Secondary | ICD-10-CM

## 2021-04-12 DIAGNOSIS — O0990 Supervision of high risk pregnancy, unspecified, unspecified trimester: Secondary | ICD-10-CM

## 2021-04-12 NOTE — Progress Notes (Signed)
   PRENATAL VISIT NOTE  Subjective:  Veronica Torres is a 35 y.o. [phone] at [redacted]w[redacted]d being seen today for ongoing prenatal care.  She is currently monitored for the following issues for this high-risk pregnancy and has History of miscarriage; Obesity in pregnancy; Supervision of high risk pregnancy, antepartum; History of preterm delivery, currently pregnant; History of diet controlled gestational diabetes mellitus (GDM); Family history of Downs syndrome; H/O: cesarean section; and Gestational diabetes on their problem list.  Patient reports no complaints.  Contractions: Irritability. Vag. Bleeding: None.  Movement: Present. Denies leaking of fluid.   The following portions of the patient's history were reviewed and updated as appropriate: allergies, current medications, past family history, past medical history, past social history, past surgical history and problem list.   Objective:   Vitals:   04/12/21 1504  BP: 123/69  Pulse: 93  Weight: 217 lb 1.6 oz (98.5 kg)    Fetal Status: Fetal Heart Rate (bpm): 148 Fundal Height: 36 cm Movement: Present     General:  Alert, oriented and cooperative. Patient is in no acute distress.  Skin: Skin is warm and dry. No rash noted.   Cardiovascular: Normal heart rate noted  Respiratory: Normal respiratory effort, no problems with respiration noted  Abdomen: Soft, gravid, appropriate for gestational age.  Pain/Pressure: Present     Pelvic: Cervical exam deferred        Extremities: Normal range of motion.  Edema: None  Mental Status: Normal mood and affect. Normal behavior. Normal judgment and thought content.   Assessment and Plan:  Pregnancy: [id number] at [redacted]w[redacted]d 1. Supervision of high risk pregnancy, antepartum - Doing well, feeling vigorous fetal movement - Feeling less anxious about delivery this week, has been matched with a volunteer doula Chief of Staff)  2. [redacted] weeks gestation of pregnancy - Routine OB care - Anticipatory guidance re GBS  treatment in labor if needed - Culture, beta strep (group b only) - GC/Chlamydia probe amp ([place])not at [facility]  Preterm labor symptoms and general obstetric precautions including but not limited to vaginal bleeding, contractions, leaking of fluid and fetal movement were reviewed in detail with the patient. Please refer to After Visit Summary for other counseling recommendations.   Return in about 1 week (around 04/19/2021) for IN-PERSON, LOB.  Future Appointments  Date Time Provider Department Center  04/19/2021  3:55 PM Adam Phenix, MD [facility]     Bernerd Limbo, CNM

## 2021-04-13 LAB — GC/CHLAMYDIA PROBE AMP (~~LOC~~) NOT AT ARMC
Chlamydia: NEGATIVE
Comment: NEGATIVE
Comment: NORMAL
Neisseria Gonorrhea: NEGATIVE

## 2021-04-16 LAB — CULTURE, BETA STREP (GROUP B ONLY): Strep Gp B Culture: NEGATIVE

## 2021-04-19 ENCOUNTER — Other Ambulatory Visit: Payer: Self-pay

## 2021-04-19 ENCOUNTER — Ambulatory Visit (INDEPENDENT_AMBULATORY_CARE_PROVIDER_SITE_OTHER): Payer: Commercial Managed Care - PPO | Admitting: Obstetrics & Gynecology

## 2021-04-19 VITALS — BP 114/67 | HR 93 | Wt 216.4 lb

## 2021-04-19 DIAGNOSIS — O0990 Supervision of high risk pregnancy, unspecified, unspecified trimester: Secondary | ICD-10-CM

## 2021-04-19 DIAGNOSIS — O24410 Gestational diabetes mellitus in pregnancy, diet controlled: Secondary | ICD-10-CM

## 2021-04-19 DIAGNOSIS — Z98891 History of uterine scar from previous surgery: Secondary | ICD-10-CM

## 2021-04-19 MED ORDER — ONETOUCH DELICA PLUS LANCET33G MISC: 12 refills | Status: DC

## 2021-04-19 NOTE — Progress Notes (Signed)
   PRENATAL VISIT NOTE  Subjective:  Veronica Torres is a 35 y.o. [phone] at [redacted]w[redacted]d being seen today for ongoing prenatal care.  She is currently monitored for the following issues for this high-risk pregnancy and has History of miscarriage; Obesity in pregnancy; Supervision of high risk pregnancy, antepartum; History of preterm delivery, currently pregnant; History of diet controlled gestational diabetes mellitus (GDM); Family history of Downs syndrome; H/O: cesarean section; and Gestational diabetes on their problem list.  Patient reports has been unable to check BG, needs lancets.  Contractions: Irritability. Vag. Bleeding: None.  Movement: Present. Denies leaking of fluid.   The following portions of the patient's history were reviewed and updated as appropriate: allergies, current medications, past family history, past medical history, past social history, past surgical history and problem list.   Objective:   Vitals:   04/19/21 1609  BP: 114/67  Pulse: 93  Weight: 216 lb 6.4 oz (98.2 kg)    Fetal Status: Fetal Heart Rate (bpm): 144   Movement: Present  Presentation: Vertex  General:  Alert, oriented and cooperative. Patient is in no acute distress.  Skin: Skin is warm and dry. No rash noted.   Cardiovascular: Normal heart rate noted  Respiratory: Normal respiratory effort, no problems with respiration noted  Abdomen: Soft, gravid, appropriate for gestational age.  Pain/Pressure: Present     Pelvic: Cervical exam performed in the presence of a chaperone Dilation: 1 Effacement (%): 20 Station: Ballotable  Extremities: Normal range of motion.  Edema: None  Mental Status: Normal mood and affect. Normal behavior. Normal judgment and thought content.   Assessment and Plan:  Pregnancy: [id number] at [redacted]w[redacted]d 1. Diet controlled gestational diabetes mellitus (GDM) in third trimester Needs to do testing as instructed - Lancets (ONETOUCH DELICA PLUS LANCET33G) MISC; Use as instructed QID  Dispense:  100 each; Refill: 12 - [place] MFM OB FOLLOW UP; Future  2. H/O: cesarean section Plans TOLAC  3. Supervision of high risk pregnancy, antepartum Needs f/u US  Term labor symptoms and general obstetric precautions including but not limited to vaginal bleeding, contractions, leaking of fluid and fetal movement were reviewed in detail with the patient. Please refer to After Visit Summary for other counseling recommendations.   Return in about 1 week (around 04/26/2021).  No future appointments.  Scheryl Darter, MD

## 2021-04-19 NOTE — Patient Instructions (Signed)
Vaginal Birth After Cesarean Delivery  Vaginal birth after cesarean delivery (VBAC) is giving birth vaginally after previously delivering a baby through a cesarean section (C-section). A VBAC may be a safe option for you, depending on your health and other factors. It is important to discuss VBAC with your health care provider early in your pregnancy so you can understand the risks, benefits, and options. Having these discussions early will give you time to make your birth plan. Who are the best candidates for VBAC? The best candidates for VBAC are women who:  Have had one or two prior cesarean deliveries, and the incision made during the delivery was horizontal (low transverse).  Do not have a vertical (classical) scar on their uterus.  Have not had a tear in the wall of their uterus (uterine rupture).  Plan to have more pregnancies. A VBAC is also more likely to be successful:  In women who have previously given birth vaginally.  When labor starts by itself (spontaneously) before the due date. What are the benefits of VBAC? The benefits of delivering your baby vaginally instead of by a cesarean delivery include:  A shorter hospital stay.  A faster recovery time.  Less pain.  Avoiding risks associated with major surgery, such as infection and blood clots.  Less blood loss and less need for donated blood (transfusions). What are the risks of VBAC? The main risk of attempting a VBAC is that it may fail, forcing your health care provider to deliver your baby by a C-section. Other risks are rare and include:  Tearing (rupture) of the scar from a past cesarean delivery.  Other risks associated with vaginal deliveries. If a repeat cesarean delivery is needed, the risks include:  Blood loss.  Infection.  Blood clot.  Damage to surrounding organs.  Removal of the uterus (hysterectomy), if it is damaged.  Placenta problems in future pregnancies. What else should I know  about my options? Delivering a baby through a VBAC is similar to having a normal spontaneous vaginal delivery. Therefore, it is safe:  To try with twins.  For your health care provider to try to turn the baby from a breech position (external cephalic version) during labor.  With epidural analgesia for pain relief. Consider where you would like to deliver your baby. VBAC should be attempted in facilities where an emergency cesarean delivery can be performed. VBAC is not recommended for home births. Any changes in your health or your baby's health during your pregnancy may make it necessary to change your initial decision about VBAC. Your health care provider may recommend that you do not attempt a VBAC if:  Your baby's suspected weight is 8.8 lb (4 kg) or more.  You have preeclampsia. This is a condition that causes high blood pressure along with other symptoms, such as swelling and headaches.  You will have VBAC less than 19 months after your cesarean delivery.  You are past your due date.  You need to have labor started (induced) because your cervix is not ready for labor (unfavorable). Where to find more information  American Pregnancy Association: americanpregnancy.org  American Congress of Obstetricians and Gynecologists: acog.org Summary  Vaginal birth after cesarean delivery (VBAC) is giving birth vaginally after previously delivering a baby through a cesarean section (C-section). A VBAC may be a safe option for you, depending on your health and other factors.  Discuss VBAC with your health care provider early in your pregnancy so you can understand the risks, benefits, options, and   have plenty of time to make your birth plan.  The main risk of attempting a VBAC is that it may fail, forcing your health care provider to deliver your baby by a C-section. Other risks are rare. This information is not intended to replace advice given to you by your health care provider. Make sure  you discuss any questions you have with your health care provider. Document Revised: 03/24/2019 Document Reviewed: 03/05/2017 Elsevier Patient Education  2021 [employer]  

## 2021-04-26 ENCOUNTER — Other Ambulatory Visit: Payer: Self-pay

## 2021-04-26 ENCOUNTER — Encounter: Payer: Self-pay | Admitting: Family Medicine

## 2021-04-26 ENCOUNTER — Ambulatory Visit (INDEPENDENT_AMBULATORY_CARE_PROVIDER_SITE_OTHER): Payer: Commercial Managed Care - PPO | Admitting: Family Medicine

## 2021-04-26 VITALS — BP 111/72 | HR 80 | Wt 216.6 lb

## 2021-04-26 DIAGNOSIS — O99210 Obesity complicating pregnancy, unspecified trimester: Secondary | ICD-10-CM

## 2021-04-26 DIAGNOSIS — Z98891 History of uterine scar from previous surgery: Secondary | ICD-10-CM

## 2021-04-26 DIAGNOSIS — O0990 Supervision of high risk pregnancy, unspecified, unspecified trimester: Secondary | ICD-10-CM

## 2021-04-26 DIAGNOSIS — O09899 Supervision of other high risk pregnancies, unspecified trimester: Secondary | ICD-10-CM

## 2021-04-26 DIAGNOSIS — O24410 Gestational diabetes mellitus in pregnancy, diet controlled: Secondary | ICD-10-CM

## 2021-04-26 NOTE — Progress Notes (Signed)
   Subjective:  Veronica Torres is a 35 y.o. [phone] at [redacted]w[redacted]d being seen today for ongoing prenatal care.  She is currently monitored for the following issues for this high-risk pregnancy and has History of miscarriage; Obesity in pregnancy; Supervision of high risk pregnancy, antepartum; History of preterm delivery, currently pregnant; History of diet controlled gestational diabetes mellitus (GDM); Family history of Downs syndrome; H/O: cesarean section; and Gestational diabetes on their problem list.  Patient reports no complaints.  Contractions: Irritability. Vag. Bleeding: None.  Movement: Present. Denies leaking of fluid.   The following portions of the patient's history were reviewed and updated as appropriate: allergies, current medications, past family history, past medical history, past social history, past surgical history and problem list. Problem list updated.  Objective:   Vitals:   04/26/21 0930  BP: 111/72  Pulse: 80  Weight: 216 lb 9.6 oz (98.2 kg)    Fetal Status: Fetal Heart Rate (bpm): 130   Movement: Present     General:  Alert, oriented and cooperative. Patient is in no acute distress.  Skin: Skin is warm and dry. No rash noted.   Cardiovascular: Normal heart rate noted  Respiratory: Normal respiratory effort, no problems with respiration noted  Abdomen: Soft, gravid, appropriate for gestational age. Pain/Pressure: Present     Pelvic: Vag. Bleeding: None     Cervical exam deferred        Extremities: Normal range of motion.  Edema: None  Mental Status: Normal mood and affect. Normal behavior. Normal judgment and thought content.   Urinalysis:      Assessment and Plan:  Pregnancy: [id number] at [redacted]w[redacted]d  1. Supervision of high risk pregnancy, antepartum BP and FHR normal Would like post partum IUD  2. Diet controlled gestational diabetes mellitus (GDM) in third trimester Well controlled, diet Needs follow up growth, rescheduled for later this week Discussed IOL  around due date, schedule reviewed and will schedule for 05/10/2021 at [redacted]w[redacted]d IOL form faxed, orders placed Confirmed cephalic by [place]  3. Obesity in pregnancy   4. History of preterm delivery, currently pregnant   5. H/O: cesarean section TOLAC consent signed 01/2021  Term labor symptoms and general obstetric precautions including but not limited to vaginal bleeding, contractions, leaking of fluid and fetal movement were reviewed in detail with the patient. Please refer to After Visit Summary for other counseling recommendations.  Return in about 1 week (around 05/03/2021) for [facility], ob visit.   Venora Maples, MD

## 2021-04-26 NOTE — Patient Instructions (Signed)
 Contraception Choices Contraception, also called birth control, refers to methods or devices that prevent pregnancy. Hormonal methods Contraceptive implant A contraceptive implant is a thin, plastic tube that contains a hormone that prevents pregnancy. It is different from an intrauterine device (IUD). It is inserted into the upper part of the arm by a health care provider. Implants can be effective for up to 3 years. Progestin-only injections Progestin-only injections are injections of progestin, a synthetic form of the hormone progesterone. They are given every 3 months by a health care provider. Birth control pills Birth control pills are pills that contain hormones that prevent pregnancy. They must be taken once a day, preferably at the same time each day. A prescription is needed to use this method of contraception. Birth control patch The birth control patch contains hormones that prevent pregnancy. It is placed on the skin and must be changed once a week for three weeks and removed on the fourth week. A prescription is needed to use this method of contraception. Vaginal ring A vaginal ring contains hormones that prevent pregnancy. It is placed in the vagina for three weeks and removed on the fourth week. After that, the process is repeated with a new ring. A prescription is needed to use this method of contraception. Emergency contraceptive Emergency contraceptives prevent pregnancy after unprotected sex. They come in pill form and can be taken up to 5 days after sex. They work best the sooner they are taken after having sex. Most emergency contraceptives are available without a prescription. This method should not be used as your only form of birth control.   Barrier methods Female condom A female condom is a thin sheath that is worn over the penis during sex. Condoms keep sperm from going inside a woman's body. They can be used with a sperm-killing substance (spermicide) to increase their  effectiveness. They should be thrown away after one use. Female condom A female condom is a soft, loose-fitting sheath that is put into the vagina before sex. The condom keeps sperm from going inside a woman's body. They should be thrown away after one use. Diaphragm A diaphragm is a soft, dome-shaped barrier. It is inserted into the vagina before sex, along with a spermicide. The diaphragm blocks sperm from entering the uterus, and the spermicide kills sperm. A diaphragm should be left in the vagina for 6-8 hours after sex and removed within 24 hours. A diaphragm is prescribed and fitted by a health care provider. A diaphragm should be replaced every 1-2 years, after giving birth, after gaining more than 15 lb (6.8 kg), and after pelvic surgery. Cervical cap A cervical cap is a round, soft latex or plastic cup that fits over the cervix. It is inserted into the vagina before sex, along with spermicide. It blocks sperm from entering the uterus. The cap should be left in place for 6-8 hours after sex and removed within 48 hours. A cervical cap must be prescribed and fitted by a health care provider. It should be replaced every 2 years. Sponge A sponge is a soft, circular piece of polyurethane foam with spermicide in it. The sponge helps block sperm from entering the uterus, and the spermicide kills sperm. To use it, you make it wet and then insert it into the vagina. It should be inserted before sex, left in for at least 6 hours after sex, and removed and thrown away within 30 hours. Spermicides Spermicides are chemicals that kill or block sperm from entering the   cervix and uterus. They can come as a cream, jelly, suppository, foam, or tablet. A spermicide should be inserted into the vagina with an applicator at least 10-15 minutes before sex to allow time for it to work. The process must be repeated every time you have sex. Spermicides do not require a prescription.   Intrauterine  contraception Intrauterine device (IUD) An IUD is a T-shaped device that is put in a woman's uterus. There are two types:  Hormone IUD.This type contains progestin, a synthetic form of the hormone progesterone. This type can stay in place for 3-5 years.  Copper IUD.This type is wrapped in copper wire. It can stay in place for 10 years. Permanent methods of contraception Female tubal ligation In this method, a woman's fallopian tubes are sealed, tied, or blocked during surgery to prevent eggs from traveling to the uterus. Hysteroscopic sterilization In this method, a small, flexible insert is placed into each fallopian tube. The inserts cause scar tissue to form in the fallopian tubes and block them, so sperm cannot reach an egg. The procedure takes about 3 months to be effective. Another form of birth control must be used during those 3 months. Female sterilization This is a procedure to tie off the tubes that carry sperm (vasectomy). After the procedure, the man can still ejaculate fluid (semen). Another form of birth control must be used for 3 months after the procedure. Natural planning methods Natural family planning In this method, a couple does not have sex on days when the woman could become pregnant. Calendar method In this method, the woman keeps track of the length of each menstrual cycle, identifies the days when pregnancy can happen, and does not have sex on those days. Ovulation method In this method, a couple avoids sex during ovulation. Symptothermal method This method involves not having sex during ovulation. The woman typically checks for ovulation by watching changes in her temperature and in the consistency of cervical mucus. Post-ovulation method In this method, a couple waits to have sex until after ovulation. Where to find more information  Centers for Disease Control and Prevention: www.cdc.gov Summary  Contraception, also called birth control, refers to methods or  devices that prevent pregnancy.  Hormonal methods of contraception include implants, injections, pills, patches, vaginal rings, and emergency contraceptives.  Barrier methods of contraception can include female condoms, female condoms, diaphragms, cervical caps, sponges, and spermicides.  There are two types of IUDs (intrauterine devices). An IUD can be put in a woman's uterus to prevent pregnancy for 3-5 years.  Permanent sterilization can be done through a procedure for males and females. Natural family planning methods involve nothaving sex on days when the woman could become pregnant. This information is not intended to replace advice given to you by your health care provider. Make sure you discuss any questions you have with your health care provider. Document Revised: 05/02/2020 Document Reviewed: 05/02/2020 Elsevier Patient Education  2021 [employer] Inc.   Breastfeeding  Choosing to breastfeed is one of the best decisions you can make for yourself and your baby. A change in hormones during pregnancy causes your breasts to make breast milk in your milk-producing glands. Hormones prevent breast milk from being released before your baby is born. They also prompt milk flow after birth. Once breastfeeding has begun, thoughts of your baby, as well as his or her sucking or crying, can stimulate the release of milk from your milk-producing glands. Benefits of breastfeeding Research shows that breastfeeding offers many health benefits   for infants and mothers. It also offers a cost-free and convenient way to feed your baby. For your baby  Your first milk (colostrum) helps your baby's digestive system to function better.  Special cells in your milk (antibodies) help your baby to fight off infections.  Breastfed babies are less likely to develop asthma, allergies, obesity, or type 2 diabetes. They are also at lower risk for sudden infant death syndrome (SIDS).  Nutrients in breast milk are better  able to meet your baby's needs compared to infant formula.  Breast milk improves your baby's brain development. For you  Breastfeeding helps to create a very special bond between you and your baby.  Breastfeeding is convenient. Breast milk costs nothing and is always available at the correct temperature.  Breastfeeding helps to burn calories. It helps you to lose the weight that you gained during pregnancy.  Breastfeeding makes your uterus return faster to its size before pregnancy. It also slows bleeding (lochia) after you give birth.  Breastfeeding helps to lower your risk of developing type 2 diabetes, osteoporosis, rheumatoid arthritis, cardiovascular disease, and breast, ovarian, uterine, and endometrial cancer later in life. Breastfeeding basics Starting breastfeeding  Find a comfortable place to sit or lie down, with your neck and back well-supported.  Place a pillow or a rolled-up blanket under your baby to bring him or her to the level of your breast (if you are seated). Nursing pillows are specially designed to help support your arms and your baby while you breastfeed.  Make sure that your baby's tummy (abdomen) is facing your abdomen.  Gently massage your breast. With your fingertips, massage from the outer edges of your breast inward toward the nipple. This encourages milk flow. If your milk flows slowly, you may need to continue this action during the feeding.  Support your breast with 4 fingers underneath and your thumb above your nipple (make the letter "C" with your hand). Make sure your fingers are well away from your nipple and your baby's mouth.  Stroke your baby's lips gently with your finger or nipple.  When your baby's mouth is open wide enough, quickly bring your baby to your breast, placing your entire nipple and as much of the areola as possible into your baby's mouth. The areola is the colored area around your nipple. ? More areola should be visible above your  baby's upper lip than below the lower lip. ? Your baby's lips should be opened and extended outward (flanged) to ensure an adequate, comfortable latch. ? Your baby's tongue should be between his or her lower gum and your breast.  Make sure that your baby's mouth is correctly positioned around your nipple (latched). Your baby's lips should create a seal on your breast and be turned out (everted).  It is common for your baby to suck about 2-3 minutes in order to start the flow of breast milk. Latching Teaching your baby how to latch onto your breast properly is very important. An improper latch can cause nipple pain, decreased milk supply, and poor weight gain in your baby. Also, if your baby is not latched onto your nipple properly, he or she may swallow some air during feeding. This can make your baby fussy. Burping your baby when you switch breasts during the feeding can help to get rid of the air. However, teaching your baby to latch on properly is still the best way to prevent fussiness from swallowing air while breastfeeding. Signs that your baby has successfully latched onto   your nipple  Silent tugging or silent sucking, without causing you pain. Infant's lips should be extended outward (flanged).  Swallowing heard between every 3-4 sucks once your milk has started to flow (after your let-down milk reflex occurs).  Muscle movement above and in front of his or her ears while sucking. Signs that your baby has not successfully latched onto your nipple  Sucking sounds or smacking sounds from your baby while breastfeeding.  Nipple pain. If you think your baby has not latched on correctly, slip your finger into the corner of your baby's mouth to break the suction and place it between your baby's gums. Attempt to start breastfeeding again. Signs of successful breastfeeding Signs from your baby  Your baby will gradually decrease the number of sucks or will completely stop sucking.  Your baby  will fall asleep.  Your baby's body will relax.  Your baby will retain a small amount of milk in his or her mouth.  Your baby will let go of your breast by himself or herself. Signs from you  Breasts that have increased in firmness, weight, and size 1-3 hours after feeding.  Breasts that are softer immediately after breastfeeding.  Increased milk volume, as well as a change in milk consistency and color by the fifth day of breastfeeding.  Nipples that are not sore, cracked, or bleeding. Signs that your baby is getting enough milk  Wetting at least 1-2 diapers during the first 24 hours after birth.  Wetting at least 5-6 diapers every 24 hours for the first week after birth. The urine should be clear or pale yellow by the age of 5 days.  Wetting 6-8 diapers every 24 hours as your baby continues to grow and develop.  At least 3 stools in a 24-hour period by the age of 5 days. The stool should be soft and yellow.  At least 3 stools in a 24-hour period by the age of 7 days. The stool should be seedy and yellow.  No loss of weight greater than 10% of birth weight during the first 3 days of life.  Average weight gain of 4-7 oz (113-198 g) per week after the age of 4 days.  Consistent daily weight gain by the age of 5 days, without weight loss after the age of 2 weeks. After a feeding, your baby may spit up a small amount of milk. This is normal. Breastfeeding frequency and duration Frequent feeding will help you make more milk and can prevent sore nipples and extremely full breasts (breast engorgement). Breastfeed when you feel the need to reduce the fullness of your breasts or when your baby shows signs of hunger. This is called "breastfeeding on demand." Signs that your baby is hungry include:  Increased alertness, activity, or restlessness.  Movement of the head from side to side.  Opening of the mouth when the corner of the mouth or cheek is stroked (rooting).  Increased  sucking sounds, smacking lips, cooing, sighing, or squeaking.  Hand-to-mouth movements and sucking on fingers or hands.  Fussing or crying. Avoid introducing a pacifier to your baby in the first 4-6 weeks after your baby is born. After this time, you may choose to use a pacifier. Research has shown that pacifier use during the first year of a baby's life decreases the risk of sudden infant death syndrome (SIDS). Allow your baby to feed on each breast as long as he or she wants. When your baby unlatches or falls asleep while feeding from the   first breast, offer the second breast. Because newborns are often sleepy in the first few weeks of life, you may need to awaken your baby to get him or her to feed. Breastfeeding times will vary from baby to baby. However, the following rules can serve as a guide to help you make sure that your baby is properly fed:  Newborns (babies 4 weeks of age or younger) may breastfeed every 1-3 hours.  Newborns should not go without breastfeeding for longer than 3 hours during the day or 5 hours during the night.  You should breastfeed your baby a minimum of 8 times in a 24-hour period. Breast milk pumping Pumping and storing breast milk allows you to make sure that your baby is exclusively fed your breast milk, even at times when you are unable to breastfeed. This is especially important if you go back to work while you are still breastfeeding, or if you are not able to be present during feedings. Your lactation consultant can help you find a method of pumping that works best for you and give you guidelines about how long it is safe to store breast milk.      Caring for your breasts while you breastfeed Nipples can become dry, cracked, and sore while breastfeeding. The following recommendations can help keep your breasts moisturized and healthy:  Avoid using soap on your nipples.  Wear a supportive bra designed especially for nursing. Avoid wearing underwire-style  bras or extremely tight bras (sports bras).  Air-dry your nipples for 3-4 minutes after each feeding.  Use only cotton bra pads to absorb leaked breast milk. Leaking of breast milk between feedings is normal.  Use lanolin on your nipples after breastfeeding. Lanolin helps to maintain your skin's normal moisture barrier. Pure lanolin is not harmful (not toxic) to your baby. You may also hand express a few drops of breast milk and gently massage that milk into your nipples and allow the milk to air-dry. In the first few weeks after giving birth, some women experience breast engorgement. Engorgement can make your breasts feel heavy, warm, and tender to the touch. Engorgement peaks within 3-5 days after you give birth. The following recommendations can help to ease engorgement:  Completely empty your breasts while breastfeeding or pumping. You may want to start by applying warm, moist heat (in the shower or with warm, water-soaked hand towels) just before feeding or pumping. This increases circulation and helps the milk flow. If your baby does not completely empty your breasts while breastfeeding, pump any extra milk after he or she is finished.  Apply ice packs to your breasts immediately after breastfeeding or pumping, unless this is too uncomfortable for you. To do this: ? Put ice in a plastic bag. ? Place a towel between your skin and the bag. ? Leave the ice on for 20 minutes, 2-3 times a day.  Make sure that your baby is latched on and positioned properly while breastfeeding. If engorgement persists after 48 hours of following these recommendations, contact your health care provider or a lactation consultant. Overall health care recommendations while breastfeeding  Eat 3 healthy meals and 3 snacks every day. Well-nourished mothers who are breastfeeding need an additional 450-500 calories a day. You can meet this requirement by increasing the amount of a balanced diet that you eat.  Drink  enough water to keep your urine pale yellow or clear.  Rest often, relax, and continue to take your prenatal vitamins to prevent fatigue, stress, and low   vitamin and mineral levels in your body (nutrient deficiencies).  Do not use any products that contain nicotine or tobacco, such as cigarettes and e-cigarettes. Your baby may be harmed by chemicals from cigarettes that pass into breast milk and exposure to secondhand smoke. If you need help quitting, ask your health care provider.  Avoid alcohol.  Do not use illegal drugs or marijuana.  Talk with your health care provider before taking any medicines. These include over-the-counter and prescription medicines as well as vitamins and herbal supplements. Some medicines that may be harmful to your baby can pass through breast milk.  It is possible to become pregnant while breastfeeding. If birth control is desired, ask your health care provider about options that will be safe while breastfeeding your baby. Where to find more information: [employer]: www.llli.org Contact a health care provider if:  You feel like you want to stop breastfeeding or have become frustrated with breastfeeding.  Your nipples are cracked or bleeding.  Your breasts are red, tender, or warm.  You have: ? Painful breasts or nipples. ? A swollen area on either breast. ? A fever or chills. ? Nausea or vomiting. ? Drainage other than breast milk from your nipples.  Your breasts do not become full before feedings by the fifth day after you give birth.  You feel sad and depressed.  Your baby is: ? Too sleepy to eat well. ? Having trouble sleeping. ? More than 1 week old and wetting fewer than 6 diapers in a 24-hour period. ? Not gaining weight by 5 days of age.  Your baby has fewer than 3 stools in a 24-hour period.  Your baby's skin or the white parts of his or her eyes become yellow. Get help right away if:  Your baby is overly tired  (lethargic) and does not want to wake up and feed.  Your baby develops an unexplained fever. Summary  Breastfeeding offers many health benefits for infant and mothers.  Try to breastfeed your infant when he or she shows early signs of hunger.  Gently tickle or stroke your baby's lips with your finger or nipple to allow the baby to open his or her mouth. Bring the baby to your breast. Make sure that much of the areola is in your baby's mouth. Offer one side and burp the baby before you offer the other side.  Talk with your health care provider or lactation consultant if you have questions or you face problems as you breastfeed. This information is not intended to replace advice given to you by your health care provider. Make sure you discuss any questions you have with your health care provider. Document Revised: 02/20/2018 Document Reviewed: 12/28/2016 Elsevier Patient Education  2021 Elsevier Inc.  

## 2021-04-27 ENCOUNTER — Encounter (HOSPITAL_COMMUNITY): Payer: Self-pay | Admitting: *Deleted

## 2021-04-27 ENCOUNTER — Telehealth (HOSPITAL_COMMUNITY): Payer: Self-pay | Admitting: *Deleted

## 2021-04-27 NOTE — Telephone Encounter (Signed)
Preadmission screen  

## 2021-04-28 ENCOUNTER — Ambulatory Visit: Payer: Commercial Managed Care - PPO | Attending: Obstetrics & Gynecology

## 2021-04-28 ENCOUNTER — Ambulatory Visit: Payer: Commercial Managed Care - PPO | Admitting: *Deleted

## 2021-04-28 ENCOUNTER — Encounter: Payer: Self-pay | Admitting: *Deleted

## 2021-04-28 ENCOUNTER — Other Ambulatory Visit: Payer: Self-pay | Admitting: Obstetrics & Gynecology

## 2021-04-28 ENCOUNTER — Other Ambulatory Visit: Payer: Self-pay

## 2021-04-28 DIAGNOSIS — O09899 Supervision of other high risk pregnancies, unspecified trimester: Secondary | ICD-10-CM | POA: Diagnosis present

## 2021-04-28 DIAGNOSIS — O24410 Gestational diabetes mellitus in pregnancy, diet controlled: Secondary | ICD-10-CM

## 2021-04-28 DIAGNOSIS — Z8632 Personal history of gestational diabetes: Secondary | ICD-10-CM | POA: Diagnosis present

## 2021-04-28 DIAGNOSIS — Z3A38 38 weeks gestation of pregnancy: Secondary | ICD-10-CM

## 2021-04-28 DIAGNOSIS — O09293 Supervision of pregnancy with other poor reproductive or obstetric history, third trimester: Secondary | ICD-10-CM

## 2021-04-28 DIAGNOSIS — O99213 Obesity complicating pregnancy, third trimester: Secondary | ICD-10-CM | POA: Diagnosis not present

## 2021-04-28 DIAGNOSIS — E669 Obesity, unspecified: Secondary | ICD-10-CM | POA: Diagnosis not present

## 2021-04-28 DIAGNOSIS — O34219 Maternal care for unspecified type scar from previous cesarean delivery: Secondary | ICD-10-CM

## 2021-04-28 DIAGNOSIS — O24419 Gestational diabetes mellitus in pregnancy, unspecified control: Secondary | ICD-10-CM

## 2021-04-28 DIAGNOSIS — O0990 Supervision of high risk pregnancy, unspecified, unspecified trimester: Secondary | ICD-10-CM | POA: Insufficient documentation

## 2021-04-28 DIAGNOSIS — O09213 Supervision of pregnancy with history of pre-term labor, third trimester: Secondary | ICD-10-CM

## 2021-04-28 DIAGNOSIS — Z362 Encounter for other antenatal screening follow-up: Secondary | ICD-10-CM

## 2021-05-02 ENCOUNTER — Other Ambulatory Visit: Payer: Self-pay | Admitting: Advanced Practice Midwife

## 2021-05-03 ENCOUNTER — Ambulatory Visit (INDEPENDENT_AMBULATORY_CARE_PROVIDER_SITE_OTHER): Payer: Commercial Managed Care - PPO | Admitting: Certified Nurse Midwife

## 2021-05-03 ENCOUNTER — Other Ambulatory Visit: Payer: Self-pay

## 2021-05-03 VITALS — BP 106/63 | HR 91 | Wt 216.5 lb

## 2021-05-03 DIAGNOSIS — Z3A39 39 weeks gestation of pregnancy: Secondary | ICD-10-CM

## 2021-05-03 DIAGNOSIS — O0993 Supervision of high risk pregnancy, unspecified, third trimester: Secondary | ICD-10-CM

## 2021-05-03 DIAGNOSIS — O24410 Gestational diabetes mellitus in pregnancy, diet controlled: Secondary | ICD-10-CM

## 2021-05-03 NOTE — Progress Notes (Signed)
Patient complains of pelvic pains and occasional contractions

## 2021-05-03 NOTE — Progress Notes (Signed)
   PRENATAL VISIT NOTE  Subjective:  Veronica Torres is a 35 y.o. [phone] at [redacted]w[redacted]d being seen today for ongoing prenatal care.  She is currently monitored for the following issues for this high-risk pregnancy and has History of miscarriage; Obesity in pregnancy; Supervision of high risk pregnancy, antepartum; History of preterm delivery, currently pregnant; History of diet controlled gestational diabetes mellitus (GDM); Family history of Downs syndrome; H/O: cesarean section; and Gestational diabetes on their problem list.  Patient reports occasional contractions.  Contractions: Irritability. Vag. Bleeding: None.  Movement: Present. Denies leaking of fluid.   The following portions of the patient's history were reviewed and updated as appropriate: allergies, current medications, past family history, past medical history, past social history, past surgical history and problem list.   Objective:   Vitals:   05/03/21 1029  BP: 106/63  Pulse: 91  Weight: 216 lb 8 oz (98.2 kg)    Fetal Status: Fetal Heart Rate (bpm): 138 Fundal Height: 39 cm Movement: Present     General:  Alert, oriented and cooperative. Patient is in no acute distress.  Skin: Skin is warm and dry. No rash noted.   Cardiovascular: Normal heart rate noted  Respiratory: Normal respiratory effort, no problems with respiration noted  Abdomen: Soft, gravid, appropriate for gestational age.  Pain/Pressure: Present     Pelvic: Cervical exam deferred        Extremities: Normal range of motion.  Edema: None  Mental Status: Normal mood and affect. Normal behavior. Normal judgment and thought content.   Assessment and Plan:  Pregnancy: [id number] at [redacted]w[redacted]d 1. Supervision of high risk pregnancy in third trimester - Doing well, still feeling vigorous fetal movement  2. [redacted] weeks gestation of pregnancy - Routine OB care  3. Diet controlled gestational diabetes mellitus (GDM) in third trimester - All fastings within range (<92), only two  postprandials >120, pt knew what she'd eaten and knows not to do so again prior to delivery. - IOL scheduled for 6/1, pt aware to wait at home until called by the hospital  Term labor symptoms and general obstetric precautions including but not limited to vaginal bleeding, contractions, leaking of fluid and fetal movement were reviewed in detail with the patient. Please refer to After Visit Summary for other counseling recommendations.   Return in 5 weeks (on 06/07/2021) for Go to induction on 05/10/21 - hospital will call you when room is ready, IN-PERSON, PP.  Future Appointments  Date Time Provider Department Center  05/09/2021  9:40 AM [facility] None  05/10/2021  7:40 AM [facility] SCHED ROOM [facility] None    Bernerd Limbo, CNM

## 2021-05-08 ENCOUNTER — Inpatient Hospital Stay (HOSPITAL_COMMUNITY)
Admission: AD | Admit: 2021-05-08 | Discharge: 2021-05-10 | DRG: 807 | Disposition: A | Discharge status: Home / Self Care | Payer: Commercial Managed Care - PPO | Attending: Family Medicine | Admitting: Family Medicine

## 2021-05-08 ENCOUNTER — Encounter (HOSPITAL_COMMUNITY): Payer: Self-pay | Admitting: Obstetrics and Gynecology

## 2021-05-08 DIAGNOSIS — O34219 Maternal care for unspecified type scar from previous cesarean delivery: Secondary | ICD-10-CM | POA: Diagnosis present

## 2021-05-08 DIAGNOSIS — Z20822 Contact with and (suspected) exposure to covid-19: Secondary | ICD-10-CM | POA: Diagnosis present

## 2021-05-08 DIAGNOSIS — O24419 Gestational diabetes mellitus in pregnancy, unspecified control: Secondary | ICD-10-CM | POA: Diagnosis present

## 2021-05-08 DIAGNOSIS — O24420 Gestational diabetes mellitus in childbirth, diet controlled: Secondary | ICD-10-CM | POA: Diagnosis present

## 2021-05-08 DIAGNOSIS — O26893 Other specified pregnancy related conditions, third trimester: Secondary | ICD-10-CM | POA: Diagnosis present

## 2021-05-08 DIAGNOSIS — O34211 Maternal care for low transverse scar from previous cesarean delivery: Secondary | ICD-10-CM | POA: Diagnosis not present

## 2021-05-08 DIAGNOSIS — Z8279 Family history of other congenital malformations, deformations and chromosomal abnormalities: Secondary | ICD-10-CM

## 2021-05-08 DIAGNOSIS — Z3A39 39 weeks gestation of pregnancy: Secondary | ICD-10-CM

## 2021-05-08 DIAGNOSIS — O326XX0 Maternal care for compound presentation, not applicable or unspecified: Secondary | ICD-10-CM | POA: Diagnosis present

## 2021-05-08 DIAGNOSIS — Z98891 History of uterine scar from previous surgery: Secondary | ICD-10-CM

## 2021-05-08 DIAGNOSIS — O09899 Supervision of other high risk pregnancies, unspecified trimester: Secondary | ICD-10-CM

## 2021-05-08 DIAGNOSIS — Z8632 Personal history of gestational diabetes: Secondary | ICD-10-CM | POA: Diagnosis present

## 2021-05-08 MED ORDER — ONDANSETRON HCL 4 MG/2ML IJ SOLN: 4.0000 mg | Freq: Four times a day (QID) | INTRAMUSCULAR | Status: DC | PRN

## 2021-05-08 MED ORDER — OXYTOCIN-SODIUM CHLORIDE 30-0.9 UT/500ML-% IV SOLN
2.5000 [IU]/h | INTRAVENOUS | Status: DC
  Administered 2021-05-09: 2.5 [IU]/h via INTRAVENOUS

## 2021-05-08 MED ORDER — TERBUTALINE SULFATE 1 MG/ML IJ SOLN: 0.2500 mg | Freq: Once | INTRAMUSCULAR | Status: DC | PRN

## 2021-05-08 MED ORDER — LIDOCAINE HCL (PF) 1 % IJ SOLN: 30.0000 mL | INTRAMUSCULAR | Status: DC | PRN

## 2021-05-08 MED ORDER — ACETAMINOPHEN 325 MG PO TABS: 650.0000 mg | ORAL_TABLET | ORAL | Status: DC | PRN

## 2021-05-08 MED ORDER — OXYTOCIN-SODIUM CHLORIDE 30-0.9 UT/500ML-% IV SOLN
1.0000 m[IU]/min | INTRAVENOUS | Status: DC
  Filled 2021-05-08: qty 500

## 2021-05-08 MED ORDER — LACTATED RINGERS IV SOLN: 500.0000 mL | INTRAVENOUS | Status: DC | PRN

## 2021-05-08 MED ORDER — LACTATED RINGERS IV SOLN: INTRAVENOUS | Status: DC

## 2021-05-08 MED ORDER — FENTANYL CITRATE (PF) 100 MCG/2ML IJ SOLN: 50.0000 ug | INTRAMUSCULAR | Status: DC | PRN

## 2021-05-08 MED ORDER — SOD CITRATE-CITRIC ACID 500-334 MG/5ML PO SOLN: 30.0000 mL | ORAL | Status: DC | PRN

## 2021-05-08 MED ORDER — OXYTOCIN BOLUS FROM INFUSION
333.0000 mL | Freq: Once | INTRAVENOUS | Status: AC
  Administered 2021-05-09: 333 mL via INTRAVENOUS

## 2021-05-08 NOTE — H&P (Signed)
OBSTETRIC ADMISSION HISTORY AND PHYSICAL  Veronica Torres is a 35 y.o. female [phone] with IUP at [redacted]w[redacted]d by 12 week ultrasound presenting for spontaneous onset of labor in the setting of A1GDM. She reports +FMs, No LOF, no VB, no blurry vision, headaches or peripheral edema, and RUQ pain.  She plans on breast feeding. She is undecided for birth control.  She received her prenatal care at [facility]   Dating: By 12 week ultrasound --->  Estimated Date of Delivery: 05/09/21  Sono:  @[redacted]w[redacted]d , CWD, normal anatomy, cephalic presentation, 3430g, EFW  Prenatal History/Complications:  - A1GDM (well controled) - h/o child with Down's syndrome and VSD (required ECMO s/p delivery) - h/o Cesarean x1 (secondary to NRFHTs)  Past Medical History: Past Medical History:  Diagnosis Date  . Gestational diabetes    last pregnancy  . Preterm labor    PTD at 59  . Previous child with Down syndrome, antepartum     Past Surgical History: Past Surgical History:  Procedure Laterality Date  . CESAREAN SECTION N/A 04/27/2019   Procedure: CESAREAN SECTION;  Surgeon: 04/29/2019, MD;  Location: [facility] LD ORS;  Service: Obstetrics;  Laterality: N/A;  . TOOTH EXTRACTION      Obstetrical History: OB History    Gravida  5   Para  3   Term  2   Preterm  1   AB  1   Living  3     SAB  1   IAB      Ectopic      Multiple  0   Live Births  3           Social History Social History   Socioeconomic History  . Marital status: Married    Spouse name: Not on file  . Number of children: Not on file  . Years of education: Not on file  . Highest education level: Not on file  Occupational History  . Not on file  Tobacco Use  . Smoking status: Never Smoker  . Smokeless tobacco: Never Used  Vaping Use  . Vaping Use: Never used  Substance and Sexual Activity  . Alcohol use: No  . Drug use: No  . Sexual activity: Not Currently    Birth control/protection: None  Other Topics Concern  . Not on  file  Social History Narrative  . Not on file   Social Determinants of Health   Financial Resource Strain: Not on file  Food Insecurity: No Food Insecurity  . Worried About [facility] in the Last Year: Never true  . Ran Out of Food in the Last Year: Never true  Transportation Needs: Unmet Transportation Needs  . Lack of Transportation (Medical): Yes  . Lack of Transportation (Non-Medical): Yes  Physical Activity: Not on file  Stress: Not on file  Social Connections: Not on file    Family History: Family History  Problem Relation Age of Onset  . Learning disabilities Daughter   . Stroke Neg Hx   . Diabetes Neg Hx   . Cancer Neg Hx     Allergies: No Known Allergies  Medications Prior to Admission  Medication Sig Dispense Refill Last Dose  . ferrous sulfate 325 (65 FE) MG EC tablet Take 1 tablet (325 mg total) by mouth every other day. 15 tablet 3 05/08/2021 at Unknown time  . Prenatal Vit-Fe Phos-FA-Omega (VITAFOL GUMMIES) 3.33-0.333-34.8 MG CHEW Chew 3 each by mouth daily. 90 tablet 11 05/08/2021 at Unknown time  .  glucose blood test strip Use as instructed QID 100 each 12   . Lancets (ONETOUCH DELICA PLUS LANCET33G) MISC Use as instructed QID 100 each 12      Review of Systems   All systems reviewed and negative except as stated in HPI  Blood pressure 125/63, pulse 91, temperature 97.8 F (36.6 C), temperature source Oral, resp. rate 16, last menstrual period 07/24/2020, SpO2 99 %, unknown if currently breastfeeding. General appearance: alert, cooperative and appears stated age Lungs: normal WOB Heart: regular rate  Abdomen: soft, non-tender Extremities: no sign of DVT Presentation: cephalic Fetal monitoringBaseline: 130 bpm, Variability: Good {> 6 bpm), Accelerations: Reactive and Decelerations: Absent Uterine activityFrequency: Every 3-5 minutes Dilation: 4 Effacement (%): 80 Station: -3 Exam by:: Lestine Box, RN   Prenatal labs: ABO, Rh: A/Positive/--  (11/18 1553) Antibody: Negative (11/18 1553) Rubella: 1.45 (11/18 1553) RPR: Non Reactive (03/09 0832)  HBsAg: Negative (11/18 1553)  HIV: Non Reactive (03/09 0832)  GBS: Negative/-- (05/04 1606)  2 hr Glucola: 93/198/234 Genetic screening declined Anatomy US wnl except for intracardiac echogenic focus  Prenatal Transfer Tool  Maternal Diabetes: Yes:  Diabetes Type:  Diet controlled Genetic Screening: Declined Maternal Ultrasounds/Referrals: Normal Fetal Ultrasounds or other Referrals:  Fetal echo, Referred to Materal Fetal Medicine  Maternal Substance Abuse:  No Significant Maternal Medications:  None Significant Maternal Lab Results: Group B Strep negative  No results found for this or any previous visit (from the past 24 hour(s)).  Patient Active Problem List   Diagnosis Date Noted  . GDM (gestational diabetes mellitus) 05/08/2021  . H/O: cesarean section 03/08/2021  . Gestational diabetes 03/08/2021  . Family history of Downs syndrome 01/16/2021  . Supervision of high risk pregnancy, antepartum 10/27/2020  . History of preterm delivery, currently pregnant 10/27/2020  . History of diet controlled gestational diabetes mellitus (GDM) 10/27/2020  . Obesity in pregnancy 03/11/2019  . History of miscarriage 08/15/2018    Assessment/Plan:  Veronica Torres is a 35 y.o. [phone] at [redacted]w[redacted]d here for spontaneous onset of labor in the setting of A1GDM and desire for TOLAC.  #TOLAC  Latent Labor  H/o Cesarean x1: Will manage expectantly and augment as clinically indicated. Reviewed risks/benefits of TOLAC versus RCS in detail. Patient counseled regarding potential vaginal delivery, chance of success, future implications, possible uterine rupture and need for urgent/emergent repeat cesarean. Counseled regarding potential need for repeat c-section for reasons unrelated to first c-section. Counseled regarding scheduled repeat cesarean including risks of bleeding, infection, damage to  surrounding tissue, abnormal placentation, implications for future pregnancies. All questions answered.  Patient desires TOLAC, consent signed 01/31/21.  #Pain: TBD per pt request #FWB: Category 1 strip #ID: GBS negative #MOF: breast #MOC: undecided; counseled on admission #Circ: desired #A1GDM: will monitor BG every 4 hours in latent labor, then every 2 hours in active labor  Bernice Mcauliffe, Skipper Cliche, MD OB Fellow, Faculty Practice 05/08/2021 11:37 PM

## 2021-05-08 NOTE — MAU Note (Signed)
Pt reports ctx have been going on for the last two days. Reports now they are 3 min apart and more painful. Denies VB and LOF, reports decreased fetal movement in the last few hours.

## 2021-05-09 ENCOUNTER — Inpatient Hospital Stay (HOSPITAL_COMMUNITY): Payer: Commercial Managed Care - PPO | Admitting: Anesthesiology

## 2021-05-09 ENCOUNTER — Other Ambulatory Visit (HOSPITAL_COMMUNITY): Payer: Commercial Managed Care - PPO | Attending: Family Medicine

## 2021-05-09 ENCOUNTER — Other Ambulatory Visit: Payer: Self-pay

## 2021-05-09 ENCOUNTER — Encounter (HOSPITAL_COMMUNITY): Payer: Self-pay | Admitting: Family Medicine

## 2021-05-09 DIAGNOSIS — O34211 Maternal care for low transverse scar from previous cesarean delivery: Secondary | ICD-10-CM

## 2021-05-09 DIAGNOSIS — O326XX0 Maternal care for compound presentation, not applicable or unspecified: Secondary | ICD-10-CM

## 2021-05-09 DIAGNOSIS — Z3A39 39 weeks gestation of pregnancy: Secondary | ICD-10-CM

## 2021-05-09 DIAGNOSIS — O24420 Gestational diabetes mellitus in childbirth, diet controlled: Secondary | ICD-10-CM

## 2021-05-09 LAB — TYPE AND SCREEN
ABO/RH(D): A POS
Antibody Screen: NEGATIVE

## 2021-05-09 LAB — CBC
HCT: 36.3 % (ref 36.0–46.0)
Hemoglobin: 10.4 g/dL — ABNORMAL LOW (ref 12.0–15.0)
MCH: 21.7 pg — ABNORMAL LOW (ref 26.0–34.0)
MCHC: 28.7 g/dL — ABNORMAL LOW (ref 30.0–36.0)
MCV: 75.6 fL — ABNORMAL LOW (ref 80.0–100.0)
Platelets: 212 10*3/uL (ref 150–400)
RBC: 4.8 MIL/uL (ref 3.87–5.11)
RDW: 16.3 % — ABNORMAL HIGH (ref 11.5–15.5)
WBC: 7.2 10*3/uL (ref 4.0–10.5)
nRBC: 0.3 % — ABNORMAL HIGH (ref 0.0–0.2)

## 2021-05-09 LAB — RPR: RPR Ser Ql: NONREACTIVE

## 2021-05-09 LAB — RESP PANEL BY RT-PCR (FLU A&B, COVID) ARPGX2
Influenza A by PCR: NEGATIVE
Influenza B by PCR: NEGATIVE
SARS Coronavirus 2 by RT PCR: NEGATIVE

## 2021-05-09 LAB — GLUCOSE, CAPILLARY: Glucose-Capillary: 104 mg/dL — ABNORMAL HIGH (ref 70–99)

## 2021-05-09 MED ORDER — FERROUS SULFATE 325 (65 FE) MG PO TABS
325.0000 mg | ORAL_TABLET | ORAL | Status: DC
  Administered 2021-05-09: 325 mg via ORAL
  Filled 2021-05-09: qty 1

## 2021-05-09 MED ORDER — EPHEDRINE 5 MG/ML INJ: 10.0000 mg | INTRAVENOUS | Status: DC | PRN

## 2021-05-09 MED ORDER — PHENYLEPHRINE 40 MCG/ML (10ML) SYRINGE FOR IV PUSH (FOR BLOOD PRESSURE SUPPORT): 80.0000 ug | PREFILLED_SYRINGE | INTRAVENOUS | Status: DC | PRN

## 2021-05-09 MED ORDER — SENNOSIDES-DOCUSATE SODIUM 8.6-50 MG PO TABS
2.0000 | ORAL_TABLET | Freq: Every day | ORAL | Status: DC
  Administered 2021-05-10: 2 via ORAL
  Filled 2021-05-09: qty 2

## 2021-05-09 MED ORDER — LACTATED RINGERS IV SOLN: 500.0000 mL | Freq: Once | INTRAVENOUS | Status: DC

## 2021-05-09 MED ORDER — DIBUCAINE (PERIANAL) 1 % EX OINT: 1.0000 "application " | TOPICAL_OINTMENT | CUTANEOUS | Status: DC | PRN

## 2021-05-09 MED ORDER — ONDANSETRON HCL 4 MG PO TABS: 4.0000 mg | ORAL_TABLET | ORAL | Status: DC | PRN

## 2021-05-09 MED ORDER — WITCH HAZEL-GLYCERIN EX PADS: 1.0000 "application " | MEDICATED_PAD | CUTANEOUS | Status: DC | PRN

## 2021-05-09 MED ORDER — SIMETHICONE 80 MG PO CHEW: 80.0000 mg | CHEWABLE_TABLET | ORAL | Status: DC | PRN

## 2021-05-09 MED ORDER — LIDOCAINE-EPINEPHRINE (PF) 2 %-1:200000 IJ SOLN
INTRAMUSCULAR | Status: DC | PRN
  Administered 2021-05-09: 4 mL via EPIDURAL

## 2021-05-09 MED ORDER — DIPHENHYDRAMINE HCL 50 MG/ML IJ SOLN: 12.5000 mg | INTRAMUSCULAR | Status: DC | PRN

## 2021-05-09 MED ORDER — TETANUS-DIPHTH-ACELL PERTUSSIS 5-2.5-18.5 LF-MCG/0.5 IM SUSY: 0.5000 mL | PREFILLED_SYRINGE | Freq: Once | INTRAMUSCULAR | Status: DC

## 2021-05-09 MED ORDER — BENZOCAINE-MENTHOL 20-0.5 % EX AERO
1.0000 "application " | INHALATION_SPRAY | CUTANEOUS | Status: DC | PRN
  Filled 2021-05-09: qty 56

## 2021-05-09 MED ORDER — IBUPROFEN 600 MG PO TABS
600.0000 mg | ORAL_TABLET | Freq: Four times a day (QID) | ORAL | Status: DC
  Administered 2021-05-09 – 2021-05-10 (×6): 600 mg via ORAL
  Filled 2021-05-09 (×6): qty 1

## 2021-05-09 MED ORDER — DIPHENHYDRAMINE HCL 25 MG PO CAPS
25.0000 mg | ORAL_CAPSULE | Freq: Four times a day (QID) | ORAL | Status: DC | PRN
Start: 2021-05-09 — End: 2021-05-10

## 2021-05-09 MED ORDER — PRENATAL MULTIVITAMIN CH
1.0000 | ORAL_TABLET | Freq: Every day | ORAL | Status: DC
  Administered 2021-05-09 – 2021-05-10 (×2): 1 via ORAL
  Filled 2021-05-09 (×2): qty 1

## 2021-05-09 MED ORDER — FENTANYL-BUPIVACAINE-NACL 0.5-0.125-0.9 MG/250ML-% EP SOLN
12.0000 mL/h | EPIDURAL | Status: DC | PRN
Start: 2021-05-09 — End: 2021-05-09
  Administered 2021-05-09: 12 mL/h via EPIDURAL
  Filled 2021-05-09: qty 250

## 2021-05-09 MED ORDER — COCONUT OIL OIL
1.0000 | TOPICAL_OIL | Status: DC | PRN
Start: 2021-05-09 — End: 2021-05-10

## 2021-05-09 MED ORDER — ACETAMINOPHEN 325 MG PO TABS
650.0000 mg | ORAL_TABLET | Freq: Four times a day (QID) | ORAL | Status: DC
  Administered 2021-05-09 – 2021-05-10 (×6): 650 mg via ORAL
  Filled 2021-05-09 (×6): qty 2

## 2021-05-09 MED ORDER — ONDANSETRON HCL 4 MG/2ML IJ SOLN: 4.0000 mg | INTRAMUSCULAR | Status: DC | PRN

## 2021-05-09 MED ORDER — DOCUSATE SODIUM 100 MG PO CAPS: 100.0000 mg | ORAL_CAPSULE | Freq: Every day | ORAL | Status: DC | PRN

## 2021-05-09 NOTE — Anesthesia Procedure Notes (Signed)
Epidural Patient location during procedure: OB Start time: 05/09/2021 1:15 AM End time: 05/09/2021 1:25 AM  Staffing Anesthesiologist: Elmer Picker, MD Performed: anesthesiologist   Preanesthetic Checklist Completed: patient identified, IV checked, risks and benefits discussed, monitors and equipment checked, pre-op evaluation and timeout performed  Epidural Patient position: sitting Prep: DuraPrep and site prepped and draped Patient monitoring: continuous pulse ox, blood pressure, heart rate and cardiac monitor Approach: midline Location: L3-L4 Injection technique: LOR air  Needle:  Needle type: Tuohy  Needle gauge: 17 G Needle length: 9 cm Needle insertion depth: 6 cm Catheter type: closed end flexible Catheter size: 19 Gauge Catheter at skin depth: 11 cm Test dose: negative  Assessment Sensory level: T8 Events: blood not aspirated, injection not painful, no injection resistance, no paresthesia and negative IV test  Additional Notes Patient identified. Risks/Benefits/Options discussed with patient including but not limited to bleeding, infection, nerve damage, paralysis, failed block, incomplete pain control, headache, blood pressure changes, nausea, vomiting, reactions to medication both or allergic, itching and postpartum back pain. Confirmed with bedside nurse the patient's most recent platelet count. Confirmed with patient that they are not currently taking any anticoagulation, have any bleeding history or any family history of bleeding disorders. Patient expressed understanding and wished to proceed. All questions were answered. Sterile technique was used throughout the entire procedure. Please see nursing notes for vital signs. Test dose was given through epidural catheter and negative prior to continuing to dose epidural or start infusion. Warning signs of high block given to the patient including shortness of breath, tingling/numbness in hands, complete motor block,  or any concerning symptoms with instructions to call for help. Patient was given instructions on fall risk and not to get out of bed. All questions and concerns addressed with instructions to call with any issues or inadequate analgesia.  Reason for block:procedure for pain

## 2021-05-09 NOTE — Discharge Summary (Signed)
Postpartum Discharge Summary    Patient Name: Veronica Torres DOB: 11/24/1986 MRN: [id number]  Date of admission: 05/08/2021 Delivery date:05/09/2021  Delivering provider: Randa Ngo  Date of discharge: 05/10/2021  Admitting diagnosis: GDM (gestational diabetes mellitus) [O24.419] Intrauterine pregnancy: [redacted]w[redacted]d    Secondary diagnosis:  Active Problems:   VBAC (vaginal birth after Cesarean)   History of preterm delivery, currently pregnant   History of diet controlled gestational diabetes mellitus (GDM)   Family history of Downs syndrome   H/O: cesarean section   Gestational diabetes   GDM (gestational diabetes mellitus)  Additional problems: as noted above   Discharge diagnosis: VBAC                                      Post partum procedures:none Augmentation: AROM Complications: None  Hospital course: Onset of Labor With Vaginal Delivery      35y.o. yo [id number] admitted in Latent Labor on 05/08/2021. She rapidly progressed to complete cervical dilation s/p AROM. Patient had an uncomplicated labor course as follows:  Membrane Rupture Time/Date: 2:01 AM ,05/09/2021   Delivery Method:VBAC, Spontaneous  Episiotomy: None  Lacerations:  None  Patient had an uncomplicated postpartum course.  She is ambulating, tolerating a regular diet, passing flatus, and urinating well. Patient is discharged home in stable condition on 05/10/21.  Newborn Data: Birth date:05/09/2021  Birth time:2:20 AM  Gender:Female  Living status:Living  Apgars:9 ,9  Weight:3629 g   Magnesium Sulfate received: No BMZ received: No Rhophylac:N/A MMR:N/A T-DaP:offered prior to discharge Flu: offered prior to discharge Transfusion:No  Physical exam  Vitals:   05/09/21 1205 05/09/21 1603 05/09/21 1614 05/09/21 2058  BP: 114/70 (!) 111/53 (!) 107/59 109/63  Pulse: 75 79  79  Resp: '18 18  15  ' Temp: 98.1 F (36.7 C) 98 F (36.7 C)  98.1 F (36.7 C)  TempSrc: Oral Oral  Oral  SpO2:    100%   Weight:      Height:       General: alert, cooperative and no distress Lochia: appropriate Uterine Fundus: firm Incision: N/A DVT Evaluation: No evidence of DVT seen on physical exam. No cords or calf tenderness. No significant calf/ankle edema. Labs: Lab Results  Component Value Date   WBC 7.2 05/08/2021   HGB 10.4 (L) 05/08/2021   HCT 36.3 05/08/2021   MCV 75.6 (L) 05/08/2021   PLT 212 05/08/2021   CMP Latest Ref Rng & Units 11/12/2020  Glucose 70 - 99 mg/dL 82  BUN 6 - 20 mg/dL 5(L)  Creatinine 0.44 - 1.00 mg/dL 0.73  Sodium 135 - 145 mmol/L 137  Potassium 3.5 - 5.1 mmol/L 3.5  Chloride 98 - 111 mmol/L 106  CO2 22 - 32 mmol/L 20(L)  Calcium 8.9 - 10.3 mg/dL 9.0  Total Protein 6.5 - 8.1 g/dL 7.2  Total Bilirubin 0.3 - 1.2 mg/dL 0.6  Alkaline Phos 38 - 126 U/L 39  AST 15 - 41 U/L 16  ALT 0 - 44 U/L 12   Edinburgh Score: Edinburgh Postnatal Depression Scale Screening Tool 05/09/2021  I have been able to laugh and see the funny side of things. 0  I have looked forward with enjoyment to things. 0  I have blamed myself unnecessarily when things went wrong. 0  I have been anxious or worried for no good reason. 0  I have felt scared or  panicky for no good reason. 0  Things have been getting on top of me. 0  I have been so unhappy that I have had difficulty sleeping. 0  I have felt sad or miserable. 0  I have been so unhappy that I have been crying. 0  The thought of harming myself has occurred to me. 0  Edinburgh Postnatal Depression Scale Total 0     After visit meds:  Allergies as of 05/10/2021   No Known Allergies     Medication List    STOP taking these medications   ferrous sulfate 325 (65 FE) MG EC tablet Replaced by: ferrous sulfate 325 (65 FE) MG tablet   glucose blood test strip   OneTouch Delica Plus [id number] Misc     TAKE these medications   acetaminophen 325 MG tablet Commonly known as: Tylenol Take 2 tablets (650 mg total) by mouth every 6  (six) hours as needed for mild pain, moderate pain, fever or headache.   coconut oil Oil Apply 1 application topically as needed (nipple pain).   ferrous sulfate 325 (65 FE) MG tablet Take 1 tablet (325 mg total) by mouth every other day. Start taking on: May 11, 2021 Replaces: ferrous sulfate 325 (65 FE) MG EC tablet   ibuprofen 600 MG tablet Commonly known as: ADVIL Take 1 tablet (600 mg total) by mouth every 6 (six) hours.   Vitafol Gummies 3.33-0.333-34.8 MG Chew Chew 3 each by mouth daily.        Discharge home in stable condition Infant Feeding: Breast Infant Disposition:home with mother Discharge instruction: per After Visit Summary and Postpartum booklet. Activity: Advance as tolerated. Pelvic rest for 6 weeks.  Diet: routine diet Future Appointments: No future appointments. Follow up Visit: Message sent to [facility] by Dr. Astrid Drafts  Please schedule this patient for a In person postpartum visit in 6 weeks with the following provider: Any provider. Additional Postpartum F/U:2 hour GTT  High risk pregnancy complicated by: [id number], h/o Cesarean x1, h/o preterm delivery Delivery mode:  VBAC, Spontaneous  Anticipated Birth Control:  Unsure (considering IUD)  Aahan Marques, Gildardo Cranker, MD OB Fellow, Faculty Practice 05/10/2021 5:37 AM

## 2021-05-09 NOTE — Anesthesia Postprocedure Evaluation (Signed)
Anesthesia Post Note  Patient: Veronica Torres  Procedure(s) Performed: AN AD HOC LABOR EPIDURAL     Patient location during evaluation: Mother Baby Anesthesia Type: Epidural Level of consciousness: awake and alert Pain management: pain level controlled Vital Signs Assessment: post-procedure vital signs reviewed and stable Respiratory status: spontaneous breathing, nonlabored ventilation and respiratory function stable Cardiovascular status: stable Postop Assessment: no headache, no backache, epidural receding, patient able to bend at knees, no apparent nausea or vomiting, able to ambulate and adequate PO intake Anesthetic complications: no   No complications documented.  Last Vitals:  Vitals:   05/09/21 0513 05/09/21 0800  BP: 128/72 115/60  Pulse: 65 84  Resp: 16 19  Temp: 36.6 C 36.5 C  SpO2:      Last Pain:  Vitals:   05/09/21 0800  TempSrc: Oral  PainSc: 0-No pain   Pain Goal:                   Blythe Stanford

## 2021-05-09 NOTE — Lactation Note (Signed)
This note was copied from a baby's chart. Lactation Consultation Note LC to room at RN request. This mother is an experienced breastfeeder and latched independently during my visit. She requested [facility] consult because of questions about feeding norms on day 1.  Patient Name: Boy Anae Hams [id number] Date: 05/09/2021 Reason for consult: Initial assessment;Mother's request Age:34 hours  Maternal Data Does the patient have breastfeeding experience prior to this delivery?: Yes How long did the patient breastfeed?: 1+ year with others +uterine contractions with bf No hx breast surgery /trauma  Feeding Mother's Current Feeding Choice: Breast Milk and Formula  LATCH Score Latch: Grasps breast easily, tongue down, lips flanged, rhythmical sucking.  Audible Swallowing: Spontaneous and intermittent  Type of Nipple: Everted at rest and after stimulation  Comfort (Breast/Nipple): Soft / non-tender  Hold (Positioning): No assistance needed to correctly position infant at breast.  LATCH Score: 10   Interventions Interventions: Education;Support pillows;Breast feeding basics reviewed   Consult Status Consult Status: PRN   Elder Negus, MA IBCLC 05/09/2021, 4:50 PM

## 2021-05-09 NOTE — Discharge Instructions (Signed)

## 2021-05-09 NOTE — Anesthesia Preprocedure Evaluation (Signed)
Anesthesia Evaluation  Patient identified by MRN, date of birth, ID band Patient awake    Reviewed: Allergy & Precautions, NPO status , Patient's Chart, lab work & pertinent test results  Airway Mallampati: III  TM Distance: >3 FB Neck ROM: Full    Dental no notable dental hx.    Pulmonary neg pulmonary ROS,    Pulmonary exam normal breath sounds clear to auscultation       Cardiovascular negative cardio ROS Normal cardiovascular exam Rhythm:Regular Rate:Normal     Neuro/Psych negative neurological ROS  negative psych ROS   GI/Hepatic negative GI ROS, Neg liver ROS,   Endo/Other  diabetes, Gestational  Renal/GU negative Renal ROS  negative genitourinary   Musculoskeletal negative musculoskeletal ROS (+)   Abdominal   Peds  Hematology negative hematology ROS (+)   Anesthesia Other Findings   Reproductive/Obstetrics (+) Pregnancy                             Anesthesia Physical Anesthesia Plan  ASA: III  Anesthesia Plan: Epidural   Post-op Pain Management:    Induction:   PONV Risk Score and Plan: Treatment may vary due to age or medical condition  Airway Management Planned: Natural Airway  Additional Equipment:   Intra-op Plan:   Post-operative Plan:   Informed Consent: I have reviewed the patients History and Physical, chart, labs and discussed the procedure including the risks, benefits and alternatives for the proposed anesthesia with the patient or authorized representative who has indicated his/her understanding and acceptance.       Plan Discussed with: Anesthesiologist  Anesthesia Plan Comments: (Patient identified. Risks, benefits, options discussed with patient including but not limited to bleeding, infection, nerve damage, paralysis, failed block, incomplete pain control, headache, blood pressure changes, nausea, vomiting, reactions to medication, itching, and  post partum back pain. Confirmed with bedside nurse the patient's most recent platelet count. Confirmed with the patient that they are not taking any anticoagulation, have any bleeding history or any family history of bleeding disorders. Patient expressed understanding and wishes to proceed. All questions were answered. )        Anesthesia Quick Evaluation

## 2021-05-10 ENCOUNTER — Inpatient Hospital Stay (HOSPITAL_COMMUNITY): Payer: Commercial Managed Care - PPO

## 2021-05-10 ENCOUNTER — Inpatient Hospital Stay (HOSPITAL_COMMUNITY)
Admission: AD | Admit: 2021-05-10 | Payer: Commercial Managed Care - PPO | Source: Home / Self Care | Admitting: Obstetrics & Gynecology

## 2021-05-10 LAB — GLUCOSE, CAPILLARY: Glucose-Capillary: 94 mg/dL (ref 70–99)

## 2021-05-10 MED ORDER — ACETAMINOPHEN 325 MG PO TABS: 650.0000 mg | ORAL_TABLET | Freq: Four times a day (QID) | ORAL | Status: AC | PRN

## 2021-05-10 MED ORDER — FERROUS SULFATE 325 (65 FE) MG PO TABS: 325.0000 mg | ORAL_TABLET | ORAL | 2 refills | Status: AC

## 2021-05-10 MED ORDER — IBUPROFEN 600 MG PO TABS: 600.0000 mg | ORAL_TABLET | Freq: Four times a day (QID) | ORAL | 0 refills | Status: AC

## 2021-05-10 MED ORDER — COCONUT OIL OIL: 1.0000 "application " | TOPICAL_OIL | 0 refills | Status: AC | PRN

## 2021-05-10 NOTE — Lactation Note (Signed)
This note was copied from a baby's chart. Lactation Consultation Note  Patient Name: Veronica Torres [id number] Date: 05/10/2021   Age:34 hours  Mom declined a lactation visit prior to d/c. Mom is a P4.  Lurline Hare [facility] 05/10/2021, 1:44 PM

## 2021-06-22 ENCOUNTER — Ambulatory Visit (INDEPENDENT_AMBULATORY_CARE_PROVIDER_SITE_OTHER): Payer: Commercial Managed Care - PPO | Admitting: Obstetrics and Gynecology

## 2021-06-22 ENCOUNTER — Encounter: Payer: Self-pay | Admitting: Obstetrics and Gynecology

## 2021-06-22 ENCOUNTER — Other Ambulatory Visit: Payer: Self-pay

## 2021-06-22 VITALS — BP 113/70 | HR 80 | Wt 200.0 lb

## 2021-06-22 DIAGNOSIS — Z8632 Personal history of gestational diabetes: Secondary | ICD-10-CM

## 2021-06-22 DIAGNOSIS — O9229 Other disorders of breast associated with pregnancy and the puerperium: Secondary | ICD-10-CM

## 2021-06-22 NOTE — Progress Notes (Signed)
Post Partum Visit Note  Veronica Torres is a 35 y.o. [phone] female who presents for a postpartum visit. She is 6 weeks postpartum following a normal spontaneous vaginal delivery. I have fully reviewed the prenatal and intrapartum course. The delivery was at 40 gestational weeks.  Anesthesia: epidural. Postpartum course has been uncomplicated. Baby is doing well. Baby is feeding by breast. Bleeding no bleeding. Bowel function is normal. Bladder function is normal. Patient is not sexually active. Contraception method is none. Postpartum depression screening: negative.  The pregnancy intention screening data noted above was reviewed. Potential methods of contraception were discussed. The patient elected to proceed with No data recorded.   Edinburgh Postnatal Depression Scale - 06/22/21 1327       Edinburgh Postnatal Depression Scale:  In the Past 7 Days   I have been able to laugh and see the funny side of things. 0    I have looked forward with enjoyment to things. 0    I have blamed myself unnecessarily when things went wrong. 0    I have been anxious or worried for no good reason. 0    I have felt scared or panicky for no good reason. 0    Things have been getting on top of me. 0    I have been so unhappy that I have had difficulty sleeping. 0    I have felt sad or miserable. 0    I have been so unhappy that I have been crying. 0    The thought of harming myself has occurred to me. 0    Edinburgh Postnatal Depression Scale Total 0             Health Maintenance Due  Topic Date Due   COVID-19 Vaccine (1) Never done   URINE MICROALBUMIN  Never done    The following portions of the patient's history were reviewed and updated as appropriate: allergies, current medications, past family history, past medical history, past social history, past surgical history, and problem list.  Review of Systems Pertinent items are noted in HPI.  Objective:  BP 113/70   Pulse 80   Wt 200 lb  (90.7 kg)   LMP 07/24/2020 (Approximate)   Breastfeeding Yes   BMI 34.33 kg/m    General:  alert, cooperative, and appears stated age   Breasts:  normal. No palpable masses, erythema or skin/nipple abnormalities  Lungs: Normal work of breathing  Heart:  Normal rate  Abdomen: Not performed  Wound Not applicable  GU exam:  not indicated       Assessment:   Veronica Torres is a 35 y.o. [id number] female who presents for a postpartum visit. S/p uncomplicated vaginal delivery on 05/09/21. Currently breastfeeding. Normal postpartum exam. Pt endorses concern of intermittent breast tenderness with feeding but reassuringly no signs/symptoms of mastitis with normal breast exam. Most likely clogged milk duct or normal sensation with milk letdown.   Plan:   Essential components of care per ACOG recommendations:  1.  Mood and well being: Patient with negative depression screening today. Reviewed local resources for support.  - Patient tobacco use? No.   - hx of drug use? No.    2. Infant care and feeding:  -Patient currently breastmilk feeding? No plans to work outside home in near future. - Intermittent breast pain; normal breast exam. Counseled on potential for clogged milk duct. -Social determinants of health (SDOH) reviewed in EPIC. No concerns. The following needs were identified: none. Pt reports good  social support at home.  3. Sexuality, contraception and birth spacing - Patient does not want a pregnancy in the next year.  Desired family size is undecided. - Reviewed forms of contraception in tiered fashion. Patient desired IUD today. Desires f/u appt. - Discussed birth spacing of 18 months. No return to sexual activity yet.  4. Sleep and fatigue -Encouraged family/partner/community support of 4 hrs of uninterrupted sleep to help with mood and fatigue  5. Physical Recovery  - Discussed patients delivery and complications. She describes her labor as good. - Patient had a Vaginal, no  problems at delivery. No laceration. Perineal healing reviewed. Patient expressed understanding - Patient has urinary incontinence? No. - Patient is safe to resume physical and sexual activity  6.  Health Maintenance - HM due items addressed Yes - Last pap smear  Diagnosis  Date Value Ref Range Status  11/04/2018   Final   NEGATIVE FOR INTRAEPITHELIAL LESIONS OR MALIGNANCY.   Pap smear not done at today's visit. Negative in 10/2018--plan to repeat at f/u appt for IUD insertion. -Breast Cancer screening indicated? N/a  7. Chronic Disease/Pregnancy Condition follow up: as needed.  8. H/o Gestational DM: f/u A1c today.  Return to clinic in 2 weeks for IUD insertion (pt preferred to defer IUD today).  Sheila Oats, MD OB Fellow, Faculty Practice 06/22/2021 2:32 PM

## 2021-06-23 LAB — HEMOGLOBIN A1C
Est. average glucose Bld gHb Est-mCnc: 114 mg/dL
Hgb A1c MFr Bld: 5.6 % (ref 4.8–5.6)

## 2021-07-25 ENCOUNTER — Ambulatory Visit: Payer: Commercial Managed Care - PPO | Admitting: Family Medicine
